# Patient Record
Sex: Male | Born: 1960 | Race: Black or African American | Hispanic: No | Marital: Married | Smoking: Former smoker
Health system: Southern US, Community
[De-identification: ages and names within clinical notes are randomized; demographics above are authoritative.]

## PROBLEM LIST (undated history)

## (undated) DIAGNOSIS — M1990 Unspecified osteoarthritis, unspecified site: Secondary | ICD-10-CM

## (undated) DIAGNOSIS — D693 Immune thrombocytopenic purpura: Secondary | ICD-10-CM

## (undated) DIAGNOSIS — I10 Essential (primary) hypertension: Secondary | ICD-10-CM

## (undated) DIAGNOSIS — D72819 Decreased white blood cell count, unspecified: Secondary | ICD-10-CM

## (undated) HISTORY — PX: SHOULDER ARTHROSCOPY W/ ROTATOR CUFF REPAIR: SHX2400

## (undated) HISTORY — PX: COLONOSCOPY: SHX174

## (undated) HISTORY — PX: EYE SURGERY: SHX253

---

## 1985-04-12 HISTORY — PX: OTHER SURGICAL HISTORY: SHX169

## 2001-04-28 ENCOUNTER — Encounter: Payer: Self-pay | Admitting: Unknown Physician Specialty

## 2001-04-28 ENCOUNTER — Ambulatory Visit (HOSPITAL_COMMUNITY): Admission: RE | Admit: 2001-04-28 | Discharge: 2001-04-28 | Payer: Self-pay | Admitting: Unknown Physician Specialty

## 2001-04-30 ENCOUNTER — Encounter: Payer: Self-pay | Admitting: Unknown Physician Specialty

## 2001-04-30 ENCOUNTER — Ambulatory Visit (HOSPITAL_COMMUNITY): Admission: RE | Admit: 2001-04-30 | Discharge: 2001-04-30 | Payer: Self-pay | Admitting: Unknown Physician Specialty

## 2001-06-05 ENCOUNTER — Encounter (HOSPITAL_COMMUNITY): Admission: RE | Admit: 2001-06-05 | Discharge: 2001-07-05 | Payer: Self-pay | Admitting: Orthopaedic Surgery

## 2006-09-14 ENCOUNTER — Encounter: Admission: RE | Admit: 2006-09-14 | Discharge: 2006-09-14 | Payer: Self-pay | Admitting: Emergency Medicine

## 2010-05-08 ENCOUNTER — Encounter: Payer: Self-pay | Admitting: Internal Medicine

## 2010-05-18 ENCOUNTER — Encounter: Payer: Self-pay | Admitting: Internal Medicine

## 2010-05-18 ENCOUNTER — Ambulatory Visit (INDEPENDENT_AMBULATORY_CARE_PROVIDER_SITE_OTHER): Payer: BC Managed Care – PPO | Admitting: Internal Medicine

## 2010-05-18 DIAGNOSIS — I10 Essential (primary) hypertension: Secondary | ICD-10-CM | POA: Insufficient documentation

## 2010-05-18 DIAGNOSIS — I498 Other specified cardiac arrhythmias: Secondary | ICD-10-CM | POA: Insufficient documentation

## 2010-05-18 DIAGNOSIS — I471 Supraventricular tachycardia: Secondary | ICD-10-CM

## 2010-05-28 NOTE — Assessment & Plan Note (Signed)
Summary: nep:per sk to double book. dx: svt. per devonna office [phone]...   CC:  nep/svt.  History of Present Illness: Dakota Carrillo is seen at the request of Dr.Ganji because of exercised induced tachycardia.  The patient is a 50 year old male with a history of hypertension submitted for stress testing during which he had runs of narrow QRS  tachycardia at cycle length of the 240 ms;  these would last 5-10 beats. They were unassociated with symptoms. The patient has no history of palpitations lightheadedness syncope or recent change in exercise tolerance.  his past medical history is also notable for hyperlipidemia. There is no description of the prior ecxho   Current Medications (verified): 1)  Pravachol 20 Mg Tabs (Pravastatin Sodium) .... Once Daily 2)  Aspirin 81 Mg Tabs (Aspirin) .... Take One Tablet Once Daily 3)  Fish Oil 1000 Mg Caps (Omega-3 Fatty Acids) .... Once Daily 4)  Niacin 500 Mg Tabs (Niacin) .... Take One Tablet Once Daily 5)  Accupril 20 Mg Tabs (Quinapril Hcl) .... Take One Tablet Two Times A Day 6)  Tiazac 180 Mg Xr24h-Cap (Diltiazem Hcl Er Beads) .... 2 Tablets Once Daily 7)  Metoprolol Tartrate 25 Mg Tabs (Metoprolol Tartrate) .... Take One Tablet Two Times A Day-Pt Has Not Started This Medication Yet  Allergies (verified): No Known Drug Allergies  Past History:  Past Medical History: Last updated: 05/15/2010 Paroxysmal supraventricular tachycardia Benign essential hypertension Mixed hyperlipidemia Abnormal exercise treadmill stress test 05/08/2010.  Family History: Last updated: 05/15/2010 Mother-hypertension Father-died at age 3 of lung cancer 4 siblings-hypertension  Social History: Last updated: 05/15/2010 Tobacco Use - Former. -quit 20 years ago.   Married-6 children  Past Surgical History: ear surgery Rotator cuff surgery  Review of Systems       full review of systems was negative apart from a history of present illness and past  medical history significant right knee injury secondary to trauma   Vital Signs:  Patient profile:   50 year old male Height:      78 inches Weight:      251 pounds BMI:     29.11 Pulse rate:   67 / minute Pulse rhythm:   regular BP sitting:   122 / 77  (left arm) Cuff size:   large  Vitals Entered By: Dakota Carrillo CMA (May 18, 2010 9:07 AM)  Physical Exam  General:  The patient was alert and oriented middle-aged [place] American male appearing his stated age in no acute distress. HEENT Normal.  Neck veins were flat, carotids were brisk. supple Lymphadenopathy-negative submandibular and cervical Lungs were clear.  Heart sounds were regular without murmurs or gallops.  Abdomen was soft with active bowel sounds. There is no clubbing cyanosis or edema. Skin Warm and dry grossly normal motor and sensory function No obvious musculoskeletal defect    Impression & Recommendations:  Problem # 1:  SUPRAVENTRICULAR TACHYCARDIA (ICD-427.89) the patient has exercise-induced nonsustained tachycardia that is likely mediated via   a concealed accessory pathway.the patient has no symptoms attributable to it at this point. I do not think that the data from [facility] and pre-excited atrial fibrillation RR intervals applies to this patient subset. Specifically I don't know that we haven't treated risk associated with closely coupled RR intervals with ventricular activition via the AV node.  I am not sure that undertaking a procedure to eliminate his accessory pathway is indicated at this point. I do agree with the use of a beta blocker. I also  think there would be value in repeat stress testing although the endpoint of this is not so clear to me.  I reviewed the above with the patient including the limitations of the data. His updated medication list for this problem includes:    Aspirin 81 Mg Tabs (Aspirin) .Marland Kitchen... Take one tablet once daily    Accupril 20 Mg Tabs (Quinapril hcl) .Marland Kitchen... Take one  tablet two times a day    Tiazac 180 Mg Xr24h-cap (Diltiazem hcl er beads) .Marland Kitchen... 2 tablets once daily    Metoprolol Tartrate 25 Mg Tabs (Metoprolol tartrate) .Marland Kitchen... Take one tablet two times a day-pt has not started this medication yet  Orders: EKG w/ Interpretation (93000)  Problem # 2:  HYPERTENSION, BENIGN (ICD-401.1)  currently controlled.  His updated medication list for this problem includes:    Aspirin 81 Mg Tabs (Aspirin) .Marland Kitchen... Take one tablet once daily    Accupril 20 Mg Tabs (Quinapril hcl) .Marland Kitchen... Take one tablet two times a day    Tiazac 180 Mg Xr24h-cap (Diltiazem hcl er beads) .Marland Kitchen... 2 tablets once daily    Metoprolol Tartrate 25 Mg Tabs (Metoprolol tartrate) .Marland Kitchen... Take one tablet two times a day-pt has not started this medication yet  Patient Instructions: 1)  Your physician recommends that you schedule a follow-up appointment in: PENDING 2)  Your physician recommends that you continue on your current medications as directed. Please refer to the Current Medication list given to you today.

## 2010-06-18 NOTE — Letter (Signed)
Summary: [facility]   Imported By: Marylou Mccoy 06/05/2010 11:42:40  _____________________________________________________________________  External Attachment:    Type:   Image     Comment:   External Document

## 2011-06-24 ENCOUNTER — Other Ambulatory Visit: Payer: Self-pay | Admitting: Family Medicine

## 2011-06-24 ENCOUNTER — Ambulatory Visit
Admission: RE | Admit: 2011-06-24 | Discharge: 2011-06-24 | Disposition: A | Discharge status: Home / Self Care | Payer: BC Managed Care – PPO | Source: Ambulatory Visit | Attending: Family Medicine | Admitting: Family Medicine

## 2011-06-24 DIAGNOSIS — Z139 Encounter for screening, unspecified: Secondary | ICD-10-CM

## 2014-02-10 HISTORY — PX: SHOULDER ARTHROSCOPY W/ ROTATOR CUFF REPAIR: SHX2400

## 2015-04-11 NOTE — H&P (Signed)
TOTAL HIP ADMISSION H&P  Patient is admitted for right total hip arthroplasty.  Subjective:  Chief Complaint: right hip pain  HPI: Dakota Carrillo, 54 y.o. male, has a history of pain and functional disability in the right hip(s) due to arthritis and patient has failed non-surgical conservative treatments for greater than 12 weeks to include NSAID's and/or analgesics, corticosteriod injections, supervised PT with diminished ADL's post treatment and activity modification.  Onset of symptoms was gradual starting 8 years ago with gradually worsening course since that time.The patient noted no past surgery on the right hip(s).  Patient currently rates pain in the right hip at 8 out of 10 with activity. Patient has night pain, worsening of pain with activity and weight bearing, pain that interfers with activities of daily living and pain with passive range of motion. Patient has evidence of joint space narrowing by imaging studies. This condition presents safety issues increasing the risk of falls.  There is no current active infection.  Patient Active Problem List   Diagnosis Date Noted  . HYPERTENSION, BENIGN 05/18/2010  . SUPRAVENTRICULAR TACHYCARDIA 05/18/2010   No past medical history on file.  No past surgical history on file.  No prescriptions prior to admission   Allergies not on file  Social History  Substance Use Topics  . Smoking status: Not on file  . Smokeless tobacco: Not on file  . Alcohol Use: Not on file    No family history on file.   Review of Systems  Constitutional: Negative for fever and chills.  HENT: Negative for congestion and sore throat.   Eyes: Negative for double vision.  Respiratory: Negative for cough, shortness of breath and wheezing.   Cardiovascular: Negative for chest pain and palpitations.  Gastrointestinal: Negative for nausea, vomiting and abdominal pain.  Musculoskeletal:       Right hip pain with prolonged ambulation and night pain  Skin:  Negative for rash.  Neurological: Negative for dizziness, loss of consciousness and headaches.  Psychiatric/Behavioral: Negative for depression and suicidal ideas.  All other systems reviewed and are negative.   Objective:  Physical Exam  Vitals reviewed. Constitutional: He is oriented to person, place, and time. He appears well-developed and well-nourished.  HENT:  Head: Normocephalic and atraumatic.  Eyes: Conjunctivae and EOM are normal. Pupils are equal, round, and reactive to light.  Neck: Normal range of motion. Neck supple.  Cardiovascular: Normal rate and intact distal pulses.   Respiratory: Effort normal and breath sounds normal.  GI: Soft. Bowel sounds are normal.  Musculoskeletal: Normal range of motion.  Right hip pain with log roll  Neurological: He is alert and oriented to person, place, and time.  Skin: Skin is warm and dry.  Psychiatric: He has a normal mood and affect. His behavior is normal. Judgment and thought content normal.    Vital signs in last 24 hours:    Labs:   Estimated body mass index is 29.01 kg/(m^2) as calculated from the following:   Height as of 05/18/10:  (1.981 m).   Weight as of 05/18/10: 113.853 kg (251 lb).   Imaging Review Plain radiographs demonstrate severe degenerative joint disease of the right hip(s). The bone quality appears to be fair for age and reported activity level.  Assessment/Plan:  End stage arthritis, right hip(s)  The patient history, physical examination, clinical judgement of the provider and imaging studies are consistent with end stage degenerative joint disease of the right hip(s) and total hip arthroplasty is deemed medically necessary.  The treatment options including medical management, injection therapy, arthroscopy and arthroplasty were discussed at length. The risks and benefits of total hip arthroplasty were presented and reviewed. The risks due to aseptic loosening, infection, stiffness,  dislocation/subluxation,  thromboembolic complications and other imponderables were discussed.  The patient acknowledged the explanation, agreed to proceed with the plan and consent was signed. Patient is being admitted for inpatient treatment for surgery, pain control, PT, OT, prophylactic antibiotics, VTE prophylaxis, progressive ambulation and ADL's and discharge planning.The patient is planning to be discharged home with home health services

## 2015-04-17 NOTE — Pre-Procedure Instructions (Signed)
Dakota EdouardLincoln J Oshima  04/17/2015     No Pharmacies Listed   Your procedure is scheduled on Tues, Jan 17 @ 10:00 AM  [facility] Admitting at 8:00 AM  Call this number if you have problems the morning of surgery:  [phone]   Remember:  Do not eat food or drink liquids after midnight.  Take these medicines the morning of surgery with A SIP OF WATER Diltiazem (Tiazac), eye drops if needed             No Goody's,BC's,Aleve,Aspirin,Ibuprofen,Advil,Motrin,Fish Oil,or any Herbal Medications, Meloxicam (Mobic)    Do not wear jewelry.  Do not wear lotions, powders, or colognes.  You may wear deodorant.             Men may shave face and neck.  Do not bring valuables to the hospital.  [facility] is not responsible for any belongings or valuables.  Contacts, dentures or bridgework may not be worn into surgery.  Leave your suitcase in the car.  After surgery it may be brought to your room.  For patients admitted to the hospital, discharge time will be determined by your treatment team.  Patients discharged the day of surgery will not be allowed to drive home.    Special instructions:  [facility] - Preparing for Surgery  Before surgery, you can play an important role.  Because skin is not sterile, your skin needs to be as free of germs as possible.  You can reduce the number of germs on you skin by washing with CHG (chlorahexidine gluconate) soap before surgery.  CHG is an antiseptic cleaner which kills germs and bonds with the skin to continue killing germs even after washing.  Please DO NOT use if you have an allergy to CHG or antibacterial soaps.  If your skin becomes reddened/irritated stop using the CHG and inform your nurse when you arrive at Short Stay.  Do not shave (including legs and underarms) for at least 48 hours prior to the first CHG shower.  You may shave your face.  Please follow these instructions carefully:   1.  Shower with CHG Soap the night  before surgery and the                                morning of Surgery.  2.  If you choose to wash your hair, wash your hair first as usual with your       normal shampoo.  3.  After you shampoo, rinse your hair and body thoroughly to remove the                      Shampoo.  4.  Use CHG as you would any other liquid soap.  You can apply chg directly       to the skin and wash gently with scrungie or a clean washcloth.  5.  Apply the CHG Soap to your body ONLY FROM THE NECK DOWN.        Do not use on open wounds or open sores.  Avoid contact with your eyes,       ears, mouth and genitals (private parts).  Wash genitals (private parts)       with your normal soap.  6.  Wash thoroughly, paying special attention to the area where your surgery        will be performed.  7.  Thoroughly rinse your body with warm water from the neck down.  8.  DO NOT shower/wash with your normal soap after using and rinsing off       the CHG Soap.  9.  Pat yourself dry with a clean towel.            10.  Wear clean pajamas.            11.  Place clean sheets on your bed the night of your first shower and do not        sleep with pets.  Day of Surgery  Do not apply any lotions/deoderants the morning of surgery.  Please wear clean clothes to the hospital/surgery center.    Please read over the following fact sheets that you were given. Pain Booklet, Coughing and Deep Breathing, Blood Transfusion Information, MRSA Information and Surgical Site Infection Prevention

## 2015-04-18 ENCOUNTER — Encounter (HOSPITAL_COMMUNITY)
Admission: RE | Admit: 2015-04-18 | Discharge: 2015-04-18 | Disposition: A | Discharge status: Home / Self Care | Payer: BLUE CROSS/BLUE SHIELD | Source: Ambulatory Visit | Attending: Orthopedic Surgery | Admitting: Orthopedic Surgery

## 2015-04-18 ENCOUNTER — Encounter (HOSPITAL_COMMUNITY): Payer: Self-pay

## 2015-04-18 DIAGNOSIS — M1611 Unilateral primary osteoarthritis, right hip: Secondary | ICD-10-CM | POA: Diagnosis not present

## 2015-04-18 DIAGNOSIS — Z0183 Encounter for blood typing: Secondary | ICD-10-CM | POA: Insufficient documentation

## 2015-04-18 DIAGNOSIS — D72819 Decreased white blood cell count, unspecified: Secondary | ICD-10-CM | POA: Insufficient documentation

## 2015-04-18 DIAGNOSIS — Z01812 Encounter for preprocedural laboratory examination: Secondary | ICD-10-CM | POA: Diagnosis not present

## 2015-04-18 DIAGNOSIS — Z79899 Other long term (current) drug therapy: Secondary | ICD-10-CM | POA: Diagnosis not present

## 2015-04-18 DIAGNOSIS — Z01818 Encounter for other preprocedural examination: Secondary | ICD-10-CM | POA: Insufficient documentation

## 2015-04-18 DIAGNOSIS — I10 Essential (primary) hypertension: Secondary | ICD-10-CM | POA: Insufficient documentation

## 2015-04-18 DIAGNOSIS — D696 Thrombocytopenia, unspecified: Secondary | ICD-10-CM | POA: Insufficient documentation

## 2015-04-18 HISTORY — DX: Immune thrombocytopenic purpura: D69.3

## 2015-04-18 HISTORY — DX: Decreased white blood cell count, unspecified: D72.819

## 2015-04-18 HISTORY — DX: Unspecified osteoarthritis, unspecified site: M19.90

## 2015-04-18 HISTORY — DX: Essential (primary) hypertension: I10

## 2015-04-18 LAB — TYPE AND SCREEN
ABO/RH(D): A POS
ANTIBODY SCREEN: NEGATIVE

## 2015-04-18 LAB — CBC
HEMATOCRIT: 36.3 % — AB (ref 39.0–52.0)
HEMOGLOBIN: 12 g/dL — AB (ref 13.0–17.0)
MCH: 31.6 pg (ref 26.0–34.0)
MCHC: 33.1 g/dL (ref 30.0–36.0)
MCV: 95.5 fL (ref 78.0–100.0)
PLATELETS: 190 10*3/uL (ref 150–400)
RBC: 3.8 MIL/uL — AB (ref 4.22–5.81)
RDW: 13.2 % (ref 11.5–15.5)
WBC: 2.1 10*3/uL — AB (ref 4.0–10.5)

## 2015-04-18 LAB — URINALYSIS, ROUTINE W REFLEX MICROSCOPIC
BILIRUBIN URINE: NEGATIVE
Glucose, UA: NEGATIVE mg/dL
HGB URINE DIPSTICK: NEGATIVE
KETONES UR: NEGATIVE mg/dL
Leukocytes, UA: NEGATIVE
Nitrite: NEGATIVE
Protein, ur: NEGATIVE mg/dL
SPECIFIC GRAVITY, URINE: 1.016 (ref 1.005–1.030)
pH: 7 (ref 5.0–8.0)

## 2015-04-18 LAB — BASIC METABOLIC PANEL
ANION GAP: 6 (ref 5–15)
BUN: 9 mg/dL (ref 6–20)
CHLORIDE: 108 mmol/L (ref 101–111)
CO2: 27 mmol/L (ref 22–32)
CREATININE: 0.76 mg/dL (ref 0.61–1.24)
Calcium: 9.2 mg/dL (ref 8.9–10.3)
GFR calc non Af Amer: >60 mL/min (ref >60)
Glucose, Bld: 93 mg/dL (ref 65–99)
POTASSIUM: 3.9 mmol/L (ref 3.5–5.1)
SODIUM: 141 mmol/L (ref 135–145)

## 2015-04-18 LAB — SURGICAL PCR SCREEN
MRSA, PCR: NEGATIVE
Staphylococcus aureus: NEGATIVE

## 2015-04-18 LAB — ABO/RH: ABO/RH(D): A POS

## 2015-04-18 LAB — PROTIME-INR
INR: 1.14 (ref 0.00–1.49)
Prothrombin Time: 14.8 seconds (ref 11.6–15.2)

## 2015-04-18 NOTE — Progress Notes (Addendum)
PCP is Dr. Mirna [facility]Gerald Hill [facility] Cardiologist is [facility] whom he sees for his high BP Request sent for any heart studies, ekg, and last office visit-from Dr Shawnee KnappLevy   Note in epic from Dr Graciela HusbandsKlein Request sent for last cbc with Dr Mirna MiresGerald Hill

## 2015-04-22 ENCOUNTER — Encounter (HOSPITAL_COMMUNITY): Payer: Self-pay

## 2015-04-22 NOTE — Progress Notes (Signed)
Anesthesia Chart Review:  Pt is 55 year old male scheduled for R total hip arthroplasty anterior approach on 04/29/2015 with Dr. Wandra Feinstein. Murphy.   PCP is Dr. Mirna MiresGerald Hill. Sees Dr. Di KindlePatel Levy with vascular surgery for HTN (care everywhere).   PMH includes:  HTN, chronic leukopenia, chronic thrombocytopenia. Never smoker. BMI 28.   Medications include: diltiazem, pravastatin, quinapril.   Preoperative labs reviewed.  WBC 2.1. No prior for comparison.   EKG 04/18/2015: NSR.  Echo 06/11/10 Jacinto Halim(Ganji):  1. LV cavity normal in size, normal diastolic filling. Normal global wall motion. Normal systolic global function. EF 58% 2. LA cavity is moderately dilated.  3. Trace MR. Normal diastolic flow pattern.   Exercise stress test 07/07/10 Jacinto Halim(Ganji): Patient exercised for 9 minutes and stress terminated due to fatigue and leg pain. There were frequent paroxysms of SVT with heart rates 260-300 bpm and in the recovery had sustained SVT for 30 seconds. Patient was asymptomatic. This is an improvement compared to the stress test done on 05/08/10.   Pt saw Dr. Jacinto HalimGanji in January 2012 and had "recurrent paroxysms of narrow complex tachycardia". Referred to Dr. Graciela HusbandsKlein for evaluation who diagnosed SVT and put pt on beta blocker. Pt's allergies now include metoprolol, and it appears pt was placed on diltiazem instead.   I spoke with pt by telephone. He reports he was not asked to return to see Dr. Graciela HusbandsKlein for follow up after initial visit in 2012. Denies palpitations, tachycardia, irregular heart beat. Also reports has chronic leukopenia and thrombocytopenia and was worked up by [employer] for the problem in the mid-1990's.   If no changes, I anticipate pt can proceed with surgery as scheduled.   Rica Mastngela Anamika Kueker, FNP-BC [facility]/Anesthesiology Phone: [phone] 04/22/2015 3:37 PM

## 2015-04-28 MED ORDER — CHLORHEXIDINE GLUCONATE 4 % EX LIQD: 60.0000 mL | Freq: Once | CUTANEOUS | Status: DC

## 2015-04-28 MED ORDER — ACETAMINOPHEN 500 MG PO TABS
1000.0000 mg | ORAL_TABLET | Freq: Once | ORAL | Status: AC
  Administered 2015-04-29: 1000 mg via ORAL

## 2015-04-28 MED ORDER — TRANEXAMIC ACID 1000 MG/10ML IV SOLN
1000.0000 mg | INTRAVENOUS | Status: AC
  Administered 2015-04-29: 1000 mg via INTRAVENOUS
  Filled 2015-04-28: qty 10

## 2015-04-28 MED ORDER — CEFAZOLIN SODIUM-DEXTROSE 2-3 GM-% IV SOLR
2.0000 g | INTRAVENOUS | Status: AC
  Administered 2015-04-29: 2 g via INTRAVENOUS

## 2015-04-28 MED ORDER — POTASSIUM CHLORIDE IN NACL 20-0.45 MEQ/L-% IV SOLN
INTRAVENOUS | Status: DC
  Filled 2015-04-28: qty 1000

## 2015-04-29 ENCOUNTER — Inpatient Hospital Stay (HOSPITAL_COMMUNITY): Payer: BLUE CROSS/BLUE SHIELD | Admitting: Vascular Surgery

## 2015-04-29 ENCOUNTER — Inpatient Hospital Stay (HOSPITAL_COMMUNITY): Payer: BLUE CROSS/BLUE SHIELD

## 2015-04-29 ENCOUNTER — Inpatient Hospital Stay (HOSPITAL_COMMUNITY): Payer: BLUE CROSS/BLUE SHIELD | Admitting: Anesthesiology

## 2015-04-29 ENCOUNTER — Encounter (HOSPITAL_COMMUNITY): Payer: Self-pay | Admitting: Certified Registered Nurse Anesthetist

## 2015-04-29 ENCOUNTER — Inpatient Hospital Stay (HOSPITAL_COMMUNITY)
Admission: RE | Admit: 2015-04-29 | Discharge: 2015-04-30 | DRG: 470 | Disposition: A | Discharge status: Home Health | Payer: BLUE CROSS/BLUE SHIELD | Source: Ambulatory Visit | Attending: Orthopedic Surgery | Admitting: Orthopedic Surgery

## 2015-04-29 ENCOUNTER — Encounter (HOSPITAL_COMMUNITY)
Admission: RE | Disposition: A | Discharge status: Home Health | Payer: Self-pay | Source: Ambulatory Visit | Attending: Orthopedic Surgery

## 2015-04-29 DIAGNOSIS — I10 Essential (primary) hypertension: Secondary | ICD-10-CM | POA: Diagnosis present

## 2015-04-29 DIAGNOSIS — Z419 Encounter for procedure for purposes other than remedying health state, unspecified: Secondary | ICD-10-CM

## 2015-04-29 DIAGNOSIS — Z96649 Presence of unspecified artificial hip joint: Secondary | ICD-10-CM

## 2015-04-29 DIAGNOSIS — D693 Immune thrombocytopenic purpura: Secondary | ICD-10-CM | POA: Diagnosis present

## 2015-04-29 DIAGNOSIS — M25551 Pain in right hip: Secondary | ICD-10-CM | POA: Diagnosis present

## 2015-04-29 DIAGNOSIS — M1611 Unilateral primary osteoarthritis, right hip: Secondary | ICD-10-CM | POA: Diagnosis present

## 2015-04-29 DIAGNOSIS — D72819 Decreased white blood cell count, unspecified: Secondary | ICD-10-CM | POA: Diagnosis present

## 2015-04-29 HISTORY — PX: TOTAL HIP ARTHROPLASTY: SHX124

## 2015-04-29 SURGERY — ARTHROPLASTY, HIP, TOTAL, ANTERIOR APPROACH
Anesthesia: Spinal | Site: Hip | Laterality: Right

## 2015-04-29 MED ORDER — METHOCARBAMOL 500 MG PO TABS
500.0000 mg | ORAL_TABLET | Freq: Four times a day (QID) | ORAL | Status: DC | PRN
  Administered 2015-04-29 – 2015-04-30 (×3): 500 mg via ORAL
  Filled 2015-04-29 (×2): qty 1

## 2015-04-29 MED ORDER — METHOCARBAMOL 1000 MG/10ML IJ SOLN
500.0000 mg | Freq: Four times a day (QID) | INTRAVENOUS | Status: DC | PRN
  Filled 2015-04-29: qty 5

## 2015-04-29 MED ORDER — CEFAZOLIN SODIUM-DEXTROSE 2-3 GM-% IV SOLR
INTRAVENOUS | Status: AC
  Filled 2015-04-29: qty 50

## 2015-04-29 MED ORDER — METOCLOPRAMIDE HCL 5 MG PO TABS: 5.0000 mg | ORAL_TABLET | Freq: Three times a day (TID) | ORAL | Status: DC | PRN

## 2015-04-29 MED ORDER — ASPIRIN EC 325 MG PO TBEC
325.0000 mg | DELAYED_RELEASE_TABLET | Freq: Every day | ORAL | Status: DC
  Administered 2015-04-30: 325 mg via ORAL
  Filled 2015-04-29: qty 1

## 2015-04-29 MED ORDER — PROPOFOL 500 MG/50ML IV EMUL
INTRAVENOUS | Status: DC | PRN
  Administered 2015-04-29: 12:00:00 via INTRAVENOUS
  Administered 2015-04-29: 50 ug/kg/min via INTRAVENOUS

## 2015-04-29 MED ORDER — DILTIAZEM HCL ER COATED BEADS 180 MG PO CP24: 180.0000 mg | ORAL_CAPSULE | Freq: Two times a day (BID) | ORAL | Status: DC

## 2015-04-29 MED ORDER — HYDROCODONE-ACETAMINOPHEN 5-325 MG PO TABS
ORAL_TABLET | ORAL | Status: AC
  Filled 2015-04-29: qty 1

## 2015-04-29 MED ORDER — QUINAPRIL HCL 10 MG PO TABS
20.0000 mg | ORAL_TABLET | Freq: Every day | ORAL | Status: DC
  Administered 2015-04-29: 20 mg via ORAL
  Filled 2015-04-29 (×2): qty 2

## 2015-04-29 MED ORDER — METHOCARBAMOL 500 MG PO TABS
ORAL_TABLET | ORAL | Status: AC
  Filled 2015-04-29: qty 1

## 2015-04-29 MED ORDER — ONDANSETRON HCL 4 MG/2ML IJ SOLN: 4.0000 mg | Freq: Once | INTRAMUSCULAR | Status: DC | PRN

## 2015-04-29 MED ORDER — LIDOCAINE HCL (CARDIAC) 20 MG/ML IV SOLN
INTRAVENOUS | Status: DC | PRN
  Administered 2015-04-29: 100 mg via INTRAVENOUS

## 2015-04-29 MED ORDER — CEFAZOLIN SODIUM-DEXTROSE 2-3 GM-% IV SOLR
2.0000 g | Freq: Four times a day (QID) | INTRAVENOUS | Status: AC
  Administered 2015-04-29 – 2015-04-30 (×2): 2 g via INTRAVENOUS
  Filled 2015-04-29 (×2): qty 50

## 2015-04-29 MED ORDER — POTASSIUM CHLORIDE IN NACL 20-0.45 MEQ/L-% IV SOLN
INTRAVENOUS | Status: DC
  Administered 2015-04-29 – 2015-04-30 (×2): via INTRAVENOUS
  Filled 2015-04-29 (×3): qty 1000

## 2015-04-29 MED ORDER — ONDANSETRON HCL 4 MG/2ML IJ SOLN: 4.0000 mg | Freq: Four times a day (QID) | INTRAMUSCULAR | Status: DC | PRN

## 2015-04-29 MED ORDER — ASPIRIN 325 MG PO TABS: 325.0000 mg | ORAL_TABLET | Freq: Every day | ORAL | Status: AC

## 2015-04-29 MED ORDER — METHOCARBAMOL 500 MG PO TABS: 500.0000 mg | ORAL_TABLET | Freq: Four times a day (QID) | ORAL | Status: AC

## 2015-04-29 MED ORDER — DOCUSATE SODIUM 100 MG PO CAPS: 100.0000 mg | ORAL_CAPSULE | Freq: Two times a day (BID) | ORAL | Status: AC

## 2015-04-29 MED ORDER — FENTANYL CITRATE (PF) 250 MCG/5ML IJ SOLN
INTRAMUSCULAR | Status: AC
  Filled 2015-04-29: qty 5

## 2015-04-29 MED ORDER — MEPERIDINE HCL 25 MG/ML IJ SOLN: 6.2500 mg | INTRAMUSCULAR | Status: DC | PRN

## 2015-04-29 MED ORDER — BUPIVACAINE HCL (PF) 0.5 % IJ SOLN
INTRAMUSCULAR | Status: DC | PRN
  Administered 2015-04-29: 3.5 mL via INTRATHECAL

## 2015-04-29 MED ORDER — FENTANYL CITRATE (PF) 250 MCG/5ML IJ SOLN
INTRAMUSCULAR | Status: DC | PRN
  Administered 2015-04-29: 50 ug via INTRAVENOUS

## 2015-04-29 MED ORDER — DILTIAZEM HCL ER BEADS 180 MG PO CP24: 180.0000 mg | ORAL_CAPSULE | Freq: Two times a day (BID) | ORAL | Status: DC

## 2015-04-29 MED ORDER — ONDANSETRON HCL 4 MG/2ML IJ SOLN
INTRAMUSCULAR | Status: AC
  Filled 2015-04-29: qty 2

## 2015-04-29 MED ORDER — HYDROCODONE-ACETAMINOPHEN 5-325 MG PO TABS
1.0000 | ORAL_TABLET | ORAL | Status: DC | PRN
  Administered 2015-04-29: 1 via ORAL
  Administered 2015-04-29: 2 via ORAL
  Administered 2015-04-29: 1 via ORAL
  Administered 2015-04-30 (×3): 2 via ORAL
  Filled 2015-04-29 (×4): qty 2

## 2015-04-29 MED ORDER — ATROPINE SULFATE 1 % OP SOLN
1.0000 [drp] | Freq: Three times a day (TID) | OPHTHALMIC | Status: DC
  Filled 2015-04-29: qty 2

## 2015-04-29 MED ORDER — ONDANSETRON HCL 4 MG PO TABS: 4.0000 mg | ORAL_TABLET | Freq: Four times a day (QID) | ORAL | Status: DC | PRN

## 2015-04-29 MED ORDER — LIDOCAINE HCL (CARDIAC) 20 MG/ML IV SOLN
INTRAVENOUS | Status: AC
  Filled 2015-04-29: qty 5

## 2015-04-29 MED ORDER — PHENYLEPHRINE 40 MCG/ML (10ML) SYRINGE FOR IV PUSH (FOR BLOOD PRESSURE SUPPORT)
PREFILLED_SYRINGE | INTRAVENOUS | Status: AC
  Filled 2015-04-29: qty 10

## 2015-04-29 MED ORDER — CELECOXIB 200 MG PO CAPS
ORAL_CAPSULE | ORAL | Status: AC
  Filled 2015-04-29: qty 1

## 2015-04-29 MED ORDER — DEXAMETHASONE SODIUM PHOSPHATE 10 MG/ML IJ SOLN: 10.0000 mg | Freq: Once | INTRAMUSCULAR | Status: DC

## 2015-04-29 MED ORDER — HYDROCODONE-ACETAMINOPHEN 5-325 MG PO TABS: 1.0000 | ORAL_TABLET | Freq: Four times a day (QID) | ORAL | Status: AC | PRN

## 2015-04-29 MED ORDER — POLYETHYLENE GLYCOL 3350 17 G PO PACK: 17.0000 g | PACK | Freq: Every day | ORAL | Status: DC | PRN

## 2015-04-29 MED ORDER — MIDAZOLAM HCL 2 MG/2ML IJ SOLN
INTRAMUSCULAR | Status: AC
  Filled 2015-04-29: qty 2

## 2015-04-29 MED ORDER — MIDAZOLAM HCL 2 MG/2ML IJ SOLN
INTRAMUSCULAR | Status: DC | PRN
  Administered 2015-04-29: 2 mg via INTRAVENOUS

## 2015-04-29 MED ORDER — METOCLOPRAMIDE HCL 5 MG/ML IJ SOLN: 5.0000 mg | Freq: Three times a day (TID) | INTRAMUSCULAR | Status: DC | PRN

## 2015-04-29 MED ORDER — BUPIVACAINE HCL (PF) 0.5 % IJ SOLN
INTRAMUSCULAR | Status: AC
  Filled 2015-04-29: qty 30

## 2015-04-29 MED ORDER — ACETAMINOPHEN 500 MG PO TABS
ORAL_TABLET | ORAL | Status: AC
  Filled 2015-04-29: qty 2

## 2015-04-29 MED ORDER — LACTATED RINGERS IV SOLN
INTRAVENOUS | Status: DC | PRN
  Administered 2015-04-29 (×2): via INTRAVENOUS

## 2015-04-29 MED ORDER — HYDROMORPHONE HCL 1 MG/ML IJ SOLN: 0.2500 mg | INTRAMUSCULAR | Status: DC | PRN

## 2015-04-29 MED ORDER — PHENOL 1.4 % MT LIQD: 1.0000 | OROMUCOSAL | Status: DC | PRN

## 2015-04-29 MED ORDER — LACTATED RINGERS IV SOLN
INTRAVENOUS | Status: DC
  Administered 2015-04-29: 10:00:00 via INTRAVENOUS

## 2015-04-29 MED ORDER — PRAVASTATIN SODIUM 20 MG PO TABS
20.0000 mg | ORAL_TABLET | Freq: Every day | ORAL | Status: DC
  Administered 2015-04-29: 20 mg via ORAL
  Filled 2015-04-29: qty 1

## 2015-04-29 MED ORDER — HYDROMORPHONE HCL 1 MG/ML IJ SOLN: 1.0000 mg | INTRAMUSCULAR | Status: DC | PRN

## 2015-04-29 MED ORDER — MENTHOL 3 MG MT LOZG: 1.0000 | LOZENGE | OROMUCOSAL | Status: DC | PRN

## 2015-04-29 MED ORDER — PROPOFOL 10 MG/ML IV BOLUS
INTRAVENOUS | Status: DC | PRN
  Administered 2015-04-29: 20 mg via INTRAVENOUS

## 2015-04-29 MED ORDER — ONDANSETRON HCL 4 MG/2ML IJ SOLN
INTRAMUSCULAR | Status: DC | PRN
  Administered 2015-04-29: 4 mg via INTRAVENOUS

## 2015-04-29 MED ORDER — 0.9 % SODIUM CHLORIDE (POUR BTL) OPTIME
TOPICAL | Status: DC | PRN
  Administered 2015-04-29: 1000 mL

## 2015-04-29 MED ORDER — ONDANSETRON HCL 4 MG PO TABS: 4.0000 mg | ORAL_TABLET | Freq: Three times a day (TID) | ORAL | Status: AC | PRN

## 2015-04-29 MED ORDER — DOCUSATE SODIUM 100 MG PO CAPS
100.0000 mg | ORAL_CAPSULE | Freq: Two times a day (BID) | ORAL | Status: DC
  Administered 2015-04-29 – 2015-04-30 (×2): 100 mg via ORAL
  Filled 2015-04-29 (×2): qty 1

## 2015-04-29 MED ORDER — CELECOXIB 200 MG PO CAPS
200.0000 mg | ORAL_CAPSULE | Freq: Two times a day (BID) | ORAL | Status: DC
  Administered 2015-04-29 – 2015-04-30 (×3): 200 mg via ORAL
  Filled 2015-04-29 (×2): qty 1

## 2015-04-29 SURGICAL SUPPLY — 54 items
BAG DECANTER FOR FLEXI CONT (MISCELLANEOUS) IMPLANT
BLADE SAG 18X100X1.27 (BLADE) IMPLANT
BLADE SAW SGTL 18X1.27X75 (BLADE) IMPLANT
BLADE SURG ROTATE 9660 (MISCELLANEOUS) IMPLANT
CAPT HIP TOTAL 2 ×1 IMPLANT
CLSR STERI-STRIP ANTIMIC 1/2X4 (GAUZE/BANDAGES/DRESSINGS) ×2 IMPLANT
COVER PERINEAL POST (MISCELLANEOUS) ×2 IMPLANT
COVER SURGICAL LIGHT HANDLE (MISCELLANEOUS) ×2 IMPLANT
DRAPE C-ARM 42X72 X-RAY (DRAPES) IMPLANT
DRAPE STERI IOBAN 125X83 (DRAPES) ×2 IMPLANT
DRAPE U-SHAPE 47X51 STRL (DRAPES) ×4 IMPLANT
DRSG MEPILEX BORDER 4X8 (GAUZE/BANDAGES/DRESSINGS) ×2 IMPLANT
DURAPREP 26ML APPLICATOR (WOUND CARE) ×2 IMPLANT
ELECT BLADE 4.0 EZ CLEAN MEGAD (MISCELLANEOUS) ×2
ELECT REM PT RETURN 9FT ADLT (ELECTROSURGICAL) ×2
ELECTRODE BLDE 4.0 EZ CLN MEGD (MISCELLANEOUS) ×1 IMPLANT
ELECTRODE REM PT RTRN 9FT ADLT (ELECTROSURGICAL) ×1 IMPLANT
FACESHIELD WRAPAROUND (MASK) ×4 IMPLANT
FACESHIELD WRAPAROUND OR TEAM (MASK) ×2 IMPLANT
GLOVE BIO SURGEON STRL SZ7 (GLOVE) ×2 IMPLANT
GLOVE BIO SURGEON STRL SZ7.5 (GLOVE) ×2 IMPLANT
GLOVE BIOGEL PI IND STRL 7.0 (GLOVE) ×1 IMPLANT
GLOVE BIOGEL PI IND STRL 8 (GLOVE) ×1 IMPLANT
GLOVE BIOGEL PI INDICATOR 7.0 (GLOVE) ×1
GLOVE BIOGEL PI INDICATOR 8 (GLOVE) ×1
GOWN STRL REUS W/ TWL LRG LVL3 (GOWN DISPOSABLE) ×2 IMPLANT
GOWN STRL REUS W/ TWL XL LVL3 (GOWN DISPOSABLE) ×1 IMPLANT
GOWN STRL REUS W/TWL LRG LVL3 (GOWN DISPOSABLE) ×4
GOWN STRL REUS W/TWL XL LVL3 (GOWN DISPOSABLE) ×2
KIT BASIN OR (CUSTOM PROCEDURE TRAY) ×2 IMPLANT
KIT ROOM TURNOVER OR (KITS) ×2 IMPLANT
MANIFOLD NEPTUNE II (INSTRUMENTS) ×2 IMPLANT
NDL 18GX1X1/2 (RX/OR ONLY) (NEEDLE) IMPLANT
NDL SAFETY ECLIPSE 18X1.5 (NEEDLE) ×1 IMPLANT
NEEDLE 18GX1X1/2 (RX/OR ONLY) (NEEDLE) IMPLANT
NEEDLE HYPO 18GX1.5 SHARP (NEEDLE) ×2
NS IRRIG 1000ML POUR BTL (IV SOLUTION) ×2 IMPLANT
PACK TOTAL JOINT (CUSTOM PROCEDURE TRAY) ×2 IMPLANT
PACK UNIVERSAL I (CUSTOM PROCEDURE TRAY) ×2 IMPLANT
PAD ARMBOARD 7.5X6 YLW CONV (MISCELLANEOUS) ×2 IMPLANT
SPONGE LAP 18X18 X RAY DECT (DISPOSABLE) IMPLANT
SUT MNCRL AB 4-0 PS2 18 (SUTURE) ×2 IMPLANT
SUT MON AB 2-0 CT1 36 (SUTURE) ×2 IMPLANT
SUT VIC AB 0 CT1 27 (SUTURE) ×2
SUT VIC AB 0 CT1 27XBRD ANBCTR (SUTURE) ×1 IMPLANT
SUT VIC AB 1 CT1 27 (SUTURE) ×2
SUT VIC AB 1 CT1 27XBRD ANBCTR (SUTURE) ×1 IMPLANT
SYR 50ML LL SCALE MARK (SYRINGE) ×2 IMPLANT
SYRINGE 20CC LL (MISCELLANEOUS) IMPLANT
TOWEL OR 17X24 6PK STRL BLUE (TOWEL DISPOSABLE) ×2 IMPLANT
TOWEL OR 17X26 10 PK STRL BLUE (TOWEL DISPOSABLE) ×2 IMPLANT
TRAY FOLEY CATH 16FRSI W/METER (SET/KITS/TRAYS/PACK) IMPLANT
WATER STERILE IRR 1000ML POUR (IV SOLUTION) ×2 IMPLANT
YANKAUER SUCT BULB TIP NO VENT (SUCTIONS) ×2 IMPLANT

## 2015-04-29 NOTE — Discharge Instructions (Signed)
INSTRUCTIONS AFTER JOINT REPLACEMENT  ° °o Remove items at home which could result in a fall. This includes throw rugs or furniture in walking pathways °o ICE to the affected joint every three hours while awake for 30 minutes at a time, for at least the first 3-5 days, and then as needed for pain and swelling.  Continue to use ice for pain and swelling. You may notice swelling that will progress down to the foot and ankle.  This is normal after surgery.  Elevate your leg when you are not up walking on it.   °o Continue to use the breathing machine you got in the hospital (incentive spirometer) which will help keep your temperature down.  It is common for your temperature to cycle up and down following surgery, especially at night when you are not up moving around and exerting yourself.  The breathing machine keeps your lungs expanded and your temperature down. ° ° °DIET:  As you were doing prior to hospitalization, we recommend a well-balanced diet. ° °DRESSING / WOUND CARE / SHOWERING ° °Keep the surgical dressing until follow up.   IF THE DRESSING FALLS OFF or the wound gets wet inside, change the dressing with sterile gauze.  Please use good hand washing techniques before changing the dressing.  Do not use any lotions or creams on the incision until instructed by your surgeon.   ° °ACTIVITY ° °o Increase activity slowly as tolerated, but follow the weight bearing instructions below.   °o No driving for 6 weeks or until further direction given by your physician.  You cannot drive while taking narcotics.  °o No lifting or carrying greater than 10 lbs. until further directed by your surgeon. °o Avoid periods of inactivity such as sitting longer than an hour when not asleep. This helps prevent blood clots.  °o You may return to work once you are authorized by your doctor.  ° ° ° °WEIGHT BEARING  ° °Weight bearing as tolerated with assist device (walker, cane, etc) as directed, use it as long as suggested by your  surgeon or therapist, typically at least 4-6 weeks. ° ° °CONSTIPATION ° °Constipation is defined medically as fewer than three stools per week and severe constipation as less than one stool per week.  Even if you have a regular bowel pattern at home, your normal regimen is likely to be disrupted due to multiple reasons following surgery.  Combination of anesthesia, postoperative narcotics, change in appetite and fluid intake all can affect your bowels.  ° °YOU MUST use at least one of the following options; they are listed in order of increasing strength to get the job done.  They are all available over the counter, and you may need to use some, POSSIBLY even all of these options:   ° °Drink plenty of fluids (prune juice may be helpful) and high fiber foods °Colace 100 mg by mouth twice a day  °Senokot for constipation as directed and as needed Dulcolax (bisacodyl), take with full glass of water  °Miralax (polyethylene glycol) once or twice a day as needed. ° °If you have tried all these things and are unable to have a bowel movement in the first 3-4 days after surgery call either your surgeon or your primary doctor.   ° °If you experience loose stools or diarrhea, hold the medications until you stool forms back up.  If your symptoms do not get better within 1 week or if they get worse, check with your doctor.    If you experience "the worst abdominal pain ever" or develop nausea or vomiting, please contact the office immediately for further recommendations for treatment. ° ° °ITCHING:  If you experience itching with your medications, try taking only a single pain pill, or even half a pain pill at a time.  You can also use Benadryl over the counter for itching or also to help with sleep.  ° °TED HOSE STOCKINGS:  Use stockings on both legs until for at least 2 weeks or as directed by physician office. They may be removed at night for sleeping. ° °MEDICATIONS:  See your medication summary on the “After Visit Summary”  that nursing will review with you.  You may have some home medications which will be placed on hold until you complete the course of blood thinner medication.  It is important for you to complete the blood thinner medication as prescribed. ° °PRECAUTIONS:  If you experience chest pain or shortness of breath - call 911 immediately for transfer to the hospital emergency department.  ° °If you develop a fever greater that 101 F, purulent drainage from wound, increased redness or drainage from wound, foul odor from the wound/dressing, or calf pain - CONTACT YOUR SURGEON.   °                                                °FOLLOW-UP APPOINTMENTS:  If you do not already have a post-op appointment, please call the office for an appointment to be seen by your surgeon.  Guidelines for how soon to be seen are listed in your “After Visit Summary”, but are typically between 1-4 weeks after surgery. ° °OTHER INSTRUCTIONS:  ° °MAKE SURE YOU:  °• Understand these instructions.  °• Get help right away if you are not doing well or get worse.  ° ° °Thank you for letting us be a part of your medical care team.  It is a privilege we respect greatly.  We hope these instructions will help you stay on track for a fast and full recovery!  ° °

## 2015-04-29 NOTE — Progress Notes (Signed)
Handoff aborted.

## 2015-04-29 NOTE — Anesthesia Postprocedure Evaluation (Signed)
Anesthesia Post Note  Patient: Dakota Carrillo  Procedure(s) Performed: Procedure(s) (LRB): TOTAL RIGHT HIP ARTHROPLASTY ANTERIOR APPROACH (Right)  Patient location during evaluation: PACU Anesthesia Type: Spinal and MAC Level of consciousness: awake and alert Pain management: pain level controlled Vital Signs Assessment: post-procedure vital signs reviewed and stable Respiratory status: spontaneous breathing and respiratory function stable Cardiovascular status: blood pressure returned to baseline and stable Postop Assessment: spinal receding Anesthetic complications: no    Last Vitals:  Filed Vitals:   04/29/15 1403 04/29/15 1415  BP: 101/66 104/68  Pulse: 63 47  Temp:    Resp: 6 14    Last Pain: There were no vitals filed for this visit.               Trammell Bowden DAVID

## 2015-04-29 NOTE — Anesthesia Procedure Notes (Signed)
Spinal Patient location during procedure: OR Start time: 04/29/2015 10:25 AM End time: 04/29/2015 10:30 AM Staffing Anesthesiologist: Arta Bruce Performed by: anesthesiologist  Preanesthetic Checklist Completed: patient identified, site marked, surgical consent, pre-op evaluation, timeout performed, IV checked, risks and benefits discussed and monitors and equipment checked Spinal Block Patient position: sitting Prep: Betadine Patient monitoring: heart rate, cardiac monitor, continuous pulse ox and blood pressure Approach: right paramedian Location: L3-4 Injection technique: single-shot Needle Needle type: Pencan  Needle gauge: 24 G Needle length: 9 cm Needle insertion depth: 9 cm

## 2015-04-29 NOTE — Anesthesia Preprocedure Evaluation (Addendum)
Anesthesia Evaluation  Patient identified by MRN, date of birth, ID band Patient awake    Reviewed: Allergy & Precautions, NPO status , Patient's Chart, lab work & pertinent test results  Airway Mallampati: II  TM Distance: >3 FB Neck ROM: Full    Dental  (+) Missing, Dental Advisory Given   Pulmonary    Pulmonary exam normal        Cardiovascular hypertension, Pt. on medications Normal cardiovascular exam     Neuro/Psych    GI/Hepatic   Endo/Other    Renal/GU      Musculoskeletal   Abdominal   Peds  Hematology   Anesthesia Other Findings   Reproductive/Obstetrics                            Anesthesia Physical Anesthesia Plan  ASA: II  Anesthesia Plan: Spinal   Post-op Pain Management:    Induction: Intravenous  Airway Management Planned: Simple Face Mask  Additional Equipment:   Intra-op Plan:   Post-operative Plan:   Informed Consent: I have reviewed the patients History and Physical, chart, labs and discussed the procedure including the risks, benefits and alternatives for the proposed anesthesia with the patient or authorized representative who has indicated his/her understanding and acceptance.     Plan Discussed with: CRNA and Surgeon  Anesthesia Plan Comments:         Anesthesia Quick Evaluation

## 2015-04-29 NOTE — Transfer of Care (Signed)
Immediate Anesthesia Transfer of Care Note  Patient: Dakota Carrillo  Procedure(s) Performed: Procedure(s): TOTAL RIGHT HIP ARTHROPLASTY ANTERIOR APPROACH (Right)  Patient Location: PACU  Anesthesia Type:Spinal  Level of Consciousness: awake, alert  and oriented  Airway & Oxygen Therapy: Patient Spontanous Breathing and Patient connected to face mask oxygen  Post-op Assessment: Report given to RN and Post -op Vital signs reviewed and stable  Post vital signs: Reviewed and stable  Last Vitals:  Filed Vitals:   04/29/15 0815 04/29/15 1308  BP: 142/70 101/58  Pulse: 63 57  Temp: 36.4 C 36.4 C  Resp: 20 9    Complications: No apparent anesthesia complications

## 2015-04-29 NOTE — Interval H&P Note (Signed)
History and Physical Interval Note:  04/29/2015 7:17 AM  [name]  has presented today for surgery, with the diagnosis of OA RIGHT HIP  The various methods of treatment have been discussed with the patient and family. After consideration of risks, benefits and other options for treatment, the patient has consented to  Procedure(s): TOTAL RIGHT HIP ARTHROPLASTY ANTERIOR APPROACH (Right) as a surgical intervention .  The patient's history has been reviewed, patient examined, no change in status, stable for surgery.  I have reviewed the patient's chart and labs.  Questions were answered to the patient's satisfaction.     Dakota Carrillo

## 2015-04-29 NOTE — Op Note (Signed)
04/29/2015  12:24 PM  PATIENT:  Dakota Carrillo   MRN: [id number]  PRE-OPERATIVE DIAGNOSIS:  OA RIGHT HIP  POST-OPERATIVE DIAGNOSIS:  OA RIGHT HIP  PROCEDURE:  Procedure(s): TOTAL RIGHT HIP ARTHROPLASTY ANTERIOR APPROACH  PREOPERATIVE INDICATIONS:    CARLEN FILS is an 55 y.o. male who has a diagnosis of <principal problem not specified> and elected for surgical management after failing conservative treatment.  The risks benefits and alternatives were discussed with the patient including but not limited to the risks of nonoperative treatment, versus surgical intervention including infection, bleeding, nerve injury, periprosthetic fracture, the need for revision surgery, dislocation, leg length discrepancy, blood clots, cardiopulmonary complications, morbidity, mortality, among others, and they were willing to proceed.     OPERATIVE REPORT     SURGEON:   Scherrie Seneca, Ernesta Amble, MD    ASSISTANT:  Lovett Calender, PA-C, She was present and scrubbed throughout the case, critical for completion in a timely fashion, and for retraction, instrumentation, and closure.     ANESTHESIA:  General    COMPLICATIONS:  None.     COMPONENTS:  Stryker acolade fit femur size 8 with a 36 mm -2.5 head ball and a PSL acetabular shell size 66 with a  polyethylene liner    PROCEDURE IN DETAIL:   The patient was met in the holding area and  identified.  The appropriate hip was identified and marked at the operative site.  The patient was then transported to the OR  and  placed under general anesthesia.  At that point, the patient was  placed in the supine position and  secured to the operating room table and all bony prominences padded. He received pre-operative antibiotics    The operative lower extremity was prepped from the iliac crest to the distal leg.  Sterile draping was performed.  Time out was performed prior to incision.      Skin incision was made just 2 cm lateral to the ASIS  extending in  line with the tensor fascia lata. Electrocautery was used to control all bleeders. I dissected down sharply to the fascia of the tensor fascia lata was confirmed that the muscle fibers beneath were running posteriorly. I then incised the fascia over the superficial tensor fascia lata in line with the incision. The fascia was elevated off the anterior aspect of the muscle the muscle was retracted posteriorly and protected throughout the case. I then used electrocautery to incise the tensor fascia lata fascia control and all bleeders. Immediately visible was the fat over top of the anterior neck and capsule.  I removed the anterior fat from the capsule and elevated the rectus muscle off of the anterior capsule. I then removed a large time of capsule. The retractors were then placed over the anterior acetabulum as well as around the superior and inferior neck.  I then removed a section of the femoral neck and a napkin ring fashion. Then used the power course to remove the femoral head from the acetabulum and thoroughly irrigated the acetabulum. I sized the femoral head.    I then exposed the deep acetabulum, cleared out any tissue including the ligamentum teres.   After adequate visualization, I excised the labrum, and then sequentially reamed.  I placed the trial acetabulum, which seated nicely, and then impacted the real cup into place.  Appropriate version and inclination was confirmed clinically matching their bony anatomy, and also with the use transverse acetabular ligament.  I placed a 32m screw in the posterior/superio  position with an excellent bite.    I then placed the polyethylene liner in place  I then abducted the leg and released the external rotators from the posterior femur allowing it to be easily delivered up lateral and anterior to the acetabulum for preparation of the femoral canal.    I then prepared the proximal femur using the cookie-cutter and then sequentially reamed and  broached.  A trial broach, neck, and head was utilized, and I reduced the hip and it was found to have excellent stability with functional range of motion..  I then impacted the real femoral prosthesis into place into the appropriate version, slightly anteverted to the normal anatomy, and I impacted the real head ball into place. The hip was then reduced and taken through functional range of motion and found to have excellent stability. Leg lengths were restored.  I then irrigated the hip copiously again with, and repaired the fascia with Vicryl, followed by monocryl for the subcutaneous tissue, Monocryl for the skin, Steri-Strips and sterile gauze. The patient was then awakened and returned to PACU in stable and satisfactory condition. There were no complications.  POST OPERATIVE PLAN: WBAT, DVT px: SCD's/TED and ASA 325  Edmonia Lynch, MD Orthopedic Surgeon [phone]   This note was generated using a template and dragon dictation system. In light of that, I have reviewed the note and all aspects of it are applicable to this case. Any dictation errors are due to the computerized dictation system.

## 2015-04-30 ENCOUNTER — Encounter (HOSPITAL_COMMUNITY): Payer: Self-pay | Admitting: General Practice

## 2015-04-30 NOTE — Care Management Note (Signed)
Case Management Note  Patient Details  Name: Dakota Carrillo MRN: [id number] Date of Birth: [birth year]  Subjective/Objective:    55 yr old gentleman s/p right total hip arthroplasty.                Action/Plan:  Case manager spoke with patient and his wife concerning home health and DME needs. Patient was preoperatively setup with [facility], no changes. CM has ordered rolling walker, 3in1 and a cane. [name] will have family support at discharge.  Expected Discharge Date:    04/30/15              Expected Discharge Plan:   Home with Home Health  In-House Referral:     Discharge planning Services  CM Consult  Post Acute Care Choice:  Durable Medical Equipment, Home Health Choice offered to:     DME Arranged:  3-N-1, Dakota Carrillo DME Agency:  [facility]  HH Arranged:  PT [facility] Agency:  [facility]  Status of Service:  Completed, signed off  Medicare Important Message Given:    Date Medicare IM Given:    Medicare IM give by:    Date Additional Medicare IM Given:    Additional Medicare Important Message give by:     If discussed at Long Length of Stay Meetings, dates discussed:    Additional Comments:  Dakota Guthrie, RN 04/30/2015, 11:17 AM

## 2015-04-30 NOTE — Evaluation (Signed)
Occupational Therapy Evaluation Patient Details Name: Dakota Carrillo MRN: [id number] DOB: 12-17-1960 Today's Date: 04/30/2015    History of Present Illness Admitted for right THA, Direct Anterior Approach;  has a past medical history of Hypertension; Arthritis; Chronic leukopenia; and Chronic idiopathic thrombocytopenia (HCC). has pertinent past surgical history that includes Shoulder arthroscopy with rotator cuff repair (Right, Nov 2015); and Shoulder arthroscopy w/ rotator cuff repair (Left).   Clinical Impression   Pt s/p above and pt planning to d/c today. Education provided in session and OT is signing off.     Follow Up Recommendations  No OT follow up;Supervision - Intermittent    Equipment Recommendations  3 in 1 bedside comode    Recommendations for Other Services       Precautions / Restrictions Precautions Precautions: None Precaution Comments: explained he has no hip precautions Restrictions Weight Bearing Restrictions: Yes RLE Weight Bearing: Weight bearing as tolerated      Mobility Bed Mobility Overal bed mobility: Modified Independent             General bed mobility comments: supine to sit  Transfers Overall transfer level: Needs assistance Transfers: Sit to/from Stand Sit to Stand: Supervision (pt stood without RW in front; setup for RW as OT retrieved)            Balance Unsteady with ambulation without use of RW.                                       ADL Overall ADL's : Needs assistance/impaired                     Lower Body Dressing: Set up;Supervision/safety;Sit to/from stand   Toilet Transfer: Set up;Supervision/safety;Ambulation;RW (sit to stand from bed)       Tub/ Shower Transfer: Min guard;Walk-in shower;Ambulation   Functional mobility during ADLs:  (Min guard for shower transfer and walking without RW; Supervision for ambulation with RW and setup to retrieve RW to place in front of pt prior  to ambulation with use RW)  General ADL Comments: Educated on safety such as safe footwear, use of bag on walker, recommended spouse be with him for shower transfer and bathing. Educated on shower transfer technique and pt practiced simulated shower transfer. Educated on LB dressing technique. Discussed AE.     Vision     Perception     Praxis      Pertinent Vitals/Pain Pain Assessment: 0-10 Pain Score: 4  Pain Location: RLE  Pain Descriptors / Indicators: Aching Pain Intervention(s): Monitored during session     Hand Dominance     Extremity/Trunk Assessment Upper Extremity Assessment Upper Extremity Assessment: Overall WFL for tasks assessed (Left shoulder-little less than full AROM shoulder flexion; history of bilateral shoulder surgery)   Lower Extremity Assessment Lower Extremity Assessment: Defer to PT evaluation   Cervical / Trunk Assessment Cervical / Trunk Assessment: Normal   Communication Communication Communication: No difficulties   Cognition Arousal/Alertness: Awake/alert Behavior During Therapy: WFL for tasks assessed/performed Overall Cognitive Status: Within Functional Limits for tasks assessed                     General Comments          Shoulder Instructions      Home Living Family/patient expects to be discharged to:: Private residence Living Arrangements: Spouse/significant other Available Help at Discharge: Family  Type of Home: House Home Access: Stairs to enter [employer] of Steps: 1 Entrance Stairs-Rails: None Home Layout: Two level;Able to live on main level with bedroom/bathroom     Bathroom Shower/Tub: Walk-in shower;Door;Tub/shower unit         Home equipment: None        Prior Functioning/Environment Level of Independence: Needs assistance    ADL's / Homemaking Assistance Needed: assist cutting toenails        OT Diagnosis: Acute pain   OT Problem List:     OT Treatment/Interventions:       OT Goals(Current goals can be found in the care plan section)   OT Frequency:     Barriers to D/C:            Co-evaluation              End of Session Equipment Utilized During Treatment: Rolling walker;Other (comment) (AE)  Activity Tolerance: Patient tolerated treatment well Patient left: in bed;with family/visitor present   Time: 4098-1191 OT Time Calculation (min): 18 min Charges:  OT General Charges $OT Visit: 1 Procedure OT Evaluation $OT Eval Low Complexity: 1 Procedure G-CodesEarlie Raveling OTR/L [phone] 04/30/2015, 12:18 PM

## 2015-04-30 NOTE — Evaluation (Signed)
Physical Therapy Evaluation and Discharge Patient Details Name: Dakota Carrillo MRN: [id number] DOB: 12-02-1960 Today's Date: 04/30/2015   History of Present Illness  Admitted for RTHA, Direct Ant Approach;  has a past medical history of Hypertension; Arthritis; Chronic leukopenia; and Chronic idiopathic thrombocytopenia ([place]). has pertinent past surgical history that includes Shoulder arthroscopy w/ rotator cuff repair (Right, Nov 2015); and Shoulder arthroscopy w/ rotator cuff repair (Left).  Clinical Impression   Patient evaluated by Physical Therapy with no further acute PT needs identified, as he is to dc home today; Managing quite well with mobiilty. All education has been completed and the patient has no further questions.  See below for any follow-up Physical Therapy or equipment needs. Acute PT is signing off. Thank you for this referral.     Follow Up Recommendations Home health PT;Supervision - Intermittent (HHPT can also address shower access questions)    Equipment Recommendations  Rolling walker with 5" wheels;3in1 (PT) (Tall)    Recommendations for Other Services       Precautions / Restrictions Precautions Precautions: None Restrictions Weight Bearing Restrictions: Yes RLE Weight Bearing: Weight bearing as tolerated      Mobility  Bed Mobility Overal bed mobility: Independent                Transfers Overall transfer level: Modified independent Equipment used: Rolling walker (2 wheeled)             General transfer comment: Managing quite well  Ambulation/Gait Ambulation/Gait assistance: Modified independent (Device/Increase time) Ambulation Distance (Feet): 400 Feet Assistive device: Rolling walker (2 wheeled) Gait Pattern/deviations: Step-through pattern     General Gait Details: Cues for more R hip stability in stance; very nice  Stairs Stairs: Yes Stairs assistance: Supervision Stair Management: One rail Right;Step to  pattern;Forwards Number of Stairs: 5 General stair comments: Cues for sequence; managing well  Wheelchair Mobility    Modified Rankin (Stroke Patients Only)       Balance Overall balance assessment: No apparent balance deficits (not formally assessed)                                           Pertinent Vitals/Pain Pain Assessment: 0-10 Pain Score: 4  Pain Location: R hip Pain Descriptors / Indicators: Aching Pain Intervention(s): Monitored during session;Patient requesting pain meds-RN notified;Repositioned    Home Living Family/patient expects to be discharged to:: Private residence Living Arrangements: Spouse/significant other Available Help at Discharge: Family;Available PRN/intermittently Type of Home: House Home Access: Stairs to enter Entrance Stairs-Rails: None Entrance Stairs-Number of Steps: 1 Home Layout: Two level;Able to live on main level with bedroom/bathroom Home Equipment: None (he may already have a shower seat) Additional Comments: Very well-versed in preop education    Prior Function Level of Independence: Independent               Hand Dominance        Extremity/Trunk Assessment   Upper Extremity Assessment: Overall WFL for tasks assessed           Lower Extremity Assessment: RLE deficits/detail;Overall WFL for tasks assessed RLE Deficits / Details: Able to complete a round of ROM therex without assist    Cervical / Trunk Assessment: Normal  Communication   Communication: No difficulties  Cognition Arousal/Alertness: Awake/alert Behavior During Therapy: WFL for tasks assessed/performed Overall Cognitive Status: Within Functional Limits for tasks assessed  General Comments      Exercises Total Joint Exercises Quad Sets: AROM;Right;20 reps Gluteal Sets: AROM;Both;10 reps Heel Slides: AROM;Right;20 reps Hip ABduction/ADduction: AROM;Right;10 reps Bridges: AROM;Both;10 reps       Assessment/Plan    PT Assessment All further PT needs can be met in the next venue of care  PT Diagnosis Difficulty walking;Acute pain   PT Problem List Decreased strength;Decreased range of motion;Decreased mobility;Decreased knowledge of use of DME;Pain;Decreased knowledge of precautions  PT Treatment Interventions     PT Goals (Current goals can be found in the Care Plan section) Acute Rehab PT Goals Patient Stated Goal: get back to better quality of life PT Goal Formulation: All assessment and education complete, DC therapy    Frequency     Barriers to discharge        Co-evaluation               End of Session   Activity Tolerance: Patient tolerated treatment well Patient left: in bed;with call bell/phone within reach Nurse Communication: Mobility status;Patient requests pain meds         Time: 0447-1580 PT Time Calculation (min) (ACUTE ONLY): 31 min   Charges:   PT Evaluation $PT Eval Low Complexity: 1 Procedure PT Treatments $Gait Training: 8-22 mins   PT G Codes:        Roney Marion Hamff 04/30/2015, 11:39 AM  Roney Marion, PT  Acute Rehabilitation Services Pager [phone] Office [phone]

## 2015-04-30 NOTE — Discharge Summary (Signed)
Physician Discharge Summary  Patient ID: Dakota Carrillo MRN: [id number] DOB/AGE: 56-Jul-1962 56 y.o.  Admit date: 04/29/2015 Discharge date: 04/30/2015  Admission Diagnoses:  Primary localized osteoarthritis of right hip  Discharge Diagnoses:  Principal Problem:   Primary localized osteoarthritis of right hip   Past Medical History  Diagnosis Date  . Hypertension   . Arthritis   . Chronic leukopenia   . Chronic idiopathic thrombocytopenia (HCC)     Surgeries: Procedure(s): TOTAL RIGHT HIP ARTHROPLASTY ANTERIOR APPROACH on 04/29/2015   Consultants (if any):    Discharged Condition: Improved  Hospital Course: Dakota Carrillo is an 55 y.o. male who was admitted 04/29/2015 with a diagnosis of Primary localized osteoarthritis of right hip and went to the operating room on 04/29/2015 and underwent the above named procedures.    He was given perioperative antibiotics:  Anti-infectives    Start     Dose/Rate Route Frequency Ordered Stop   04/29/15 2000  ceFAZolin (ANCEF) IVPB 2 g/50 mL premix     2 g 100 mL/hr over 30 Minutes Intravenous Every 6 hours 04/29/15 1934 04/30/15 0247   04/29/15 1000  ceFAZolin (ANCEF) IVPB 2 g/50 mL premix     2 g 100 mL/hr over 30 Minutes Intravenous To ShortStay Surgical 04/28/15 1130 04/29/15 1040   04/29/15 0727  ceFAZolin (ANCEF) 2-3 GM-% IVPB SOLR    Comments:  Edwina Barth   : cabinet override      04/29/15 0727 04/29/15 1929    .  He was given sequential compression devices, early ambulation, and ASA  for DVT prophylaxis.  He benefited maximally from the hospital stay and there were no complications.    Recent vital signs:  Filed Vitals:   04/30/15 0042 04/30/15 0501  BP: 107/60 107/61  Pulse: 75 72  Temp: 98.8 F (37.1 C) 98.8 F (37.1 C)  Resp: 16 16    Recent laboratory studies:  Lab Results  Component Value Date   HGB 12.0* 04/18/2015   Lab Results  Component Value Date   WBC 2.1* 04/18/2015   PLT 190  04/18/2015   Lab Results  Component Value Date   INR 1.14 04/18/2015   Lab Results  Component Value Date   NA 141 04/18/2015   K 3.9 04/18/2015   CL 108 04/18/2015   CO2 27 04/18/2015   BUN 9 04/18/2015   CREATININE 0.76 04/18/2015   GLUCOSE 93 04/18/2015    Discharge Medications:     Medication List    TAKE these medications        aspirin 325 MG tablet  Take 1 tablet (325 mg total) by mouth daily.     atropine 1 % ophthalmic solution  Place 1 drop into the right eye 3 (three) times daily.     diltiazem 180 MG 24 hr capsule  Commonly known as:  TIAZAC  Take 180 mg by mouth 2 (two) times daily.     docusate sodium 100 MG capsule  Commonly known as:  COLACE  Take 1 capsule (100 mg total) by mouth 2 (two) times daily.     HYDROcodone-acetaminophen 5-325 MG tablet  Commonly known as:  NORCO  Take 1-2 tablets by mouth every 6 (six) hours as needed for moderate pain.     meloxicam 15 MG tablet  Commonly known as:  MOBIC  Take 15 mg by mouth daily as needed for pain.     methocarbamol 500 MG tablet  Commonly known as:  ROBAXIN  Take 1  tablet (500 mg total) by mouth 4 (four) times daily.     ondansetron 4 MG tablet  Commonly known as:  ZOFRAN  Take 1 tablet (4 mg total) by mouth every 8 (eight) hours as needed for nausea or vomiting.     pravastatin 20 MG tablet  Commonly known as:  PRAVACHOL  Take 20 mg by mouth daily.     prednisoLONE sodium phosphate 1 % ophthalmic solution  Commonly known as:  INFLAMASE FORTE  Place 1 drop into the right eye 4 (four) times daily as needed.     quinapril 20 MG tablet  Commonly known as:  ACCUPRIL  Take 20 mg by mouth at bedtime.        Diagnostic Studies: Dg Pelvis Portable  04/29/2015  CLINICAL DATA:  Patient status post right hip arthroplasty. EXAM: PORTABLE PELVIS 1-2 VIEWS COMPARISON:  Earlier same day intraoperative radiographs. FINDINGS: Patient status post right hip arthroplasty. Hardware appears intact. Left  hip joint degenerative changes. Degenerative changes bilateral SI joints. IMPRESSION: Patient status post right hip arthroplasty. Electronically Signed   By: Annia Belt M.D.   On: 04/29/2015 14:41   Dg Hip Operative Unilat With Pelvis Right  04/29/2015  CLINICAL DATA:  Status post right hip replacement today. Initial encounter. EXAM: OPERATIVE RIGHT HIP (WITH PELVIS IF PERFORMED) 2 VIEWS TECHNIQUE: Fluoroscopic spot image(s) were submitted for interpretation post-operatively. COMPARISON:  None. FINDINGS: Two fluoroscopic intraoperative spot views are provided. Images demonstrate a right total hip arthroplasty in place. The device is located. No acute abnormality is identified. IMPRESSION: Right total hip arthroplasty.  No acute finding. Electronically Signed   By: Drusilla Kanner M.D.   On: 04/29/2015 13:34    Disposition: Final discharge disposition not confirmed      Discharge Instructions    Weight bearing as tolerated    Complete by:  As directed   Laterality:  right  Extremity:  Lower           Follow-up Information    Follow up with MURPHY, TIMOTHY D, MD In 10 days.   Specialty:  Orthopedic Surgery   Contact information:   [address] [place] [address] [phone]        Signed: Lynann Bologna 04/30/2015, 7:33 AM Cell [phone]

## 2015-04-30 NOTE — Progress Notes (Signed)
     Subjective:  POD#1 RATH. Patient reports pain as mild to moderate.  Resting comfortably in bed this morning.  No issues overnight.  Will see how the patient mobilizes with PT.  Except he will be appropriate for discharge this afternoon.   Objective:   VITALS:   Filed Vitals:   04/29/15 1900 04/29/15 1952 04/30/15 0042 04/30/15 0501  BP: 120/84 123/70 107/60 107/61  Pulse: 63 71 75 72  Temp: 98 F (36.7 C) 99.8 F (37.7 C) 98.8 F (37.1 C) 98.8 F (37.1 C)  TempSrc:  Oral Oral Oral  Resp: Height:      Weight:      SpO2: 100% 99% 97% 97%    Neurologically intact ABD soft Neurovascular intact Sensation intact distally Intact pulses distally Dorsiflexion/Plantar flexion intact Incision: scant drainage   Lab Results  Component Value Date   WBC 2.1* 04/18/2015   HGB 12.0* 04/18/2015   HCT 36.3* 04/18/2015   MCV 95.5 04/18/2015   PLT 190 04/18/2015   BMET    Component Value Date/Time   NA 141 04/18/2015 1118   K 3.9 04/18/2015 1118   CL 108 04/18/2015 1118   CO2 27 04/18/2015 1118   GLUCOSE 93 04/18/2015 1118   BUN 9 04/18/2015 1118   CREATININE 0.76 04/18/2015 1118   CALCIUM 9.2 04/18/2015 1118   GFRNONAA >60 04/18/2015 1118   GFRAA >60 04/18/2015 1118     Assessment/Plan: 1 Day Post-Op   Principal Problem:   Primary localized osteoarthritis of right hip   Up with therapy WBAT in the RLE ASA  daily for 30 days for DVT prophylaxis Plan for discharge this afternoon as long as mobilizes well with PT.    Lynann Bologna 04/30/2015, 7:31 AM Cell [phone]

## 2015-08-12 DIAGNOSIS — H2512 Age-related nuclear cataract, left eye: Secondary | ICD-10-CM | POA: Diagnosis not present

## 2015-08-12 DIAGNOSIS — H35033 Hypertensive retinopathy, bilateral: Secondary | ICD-10-CM | POA: Diagnosis not present

## 2015-08-12 DIAGNOSIS — Z961 Presence of intraocular lens: Secondary | ICD-10-CM | POA: Diagnosis not present

## 2015-08-12 DIAGNOSIS — H209 Unspecified iridocyclitis: Secondary | ICD-10-CM | POA: Diagnosis not present

## 2015-09-23 DIAGNOSIS — Z961 Presence of intraocular lens: Secondary | ICD-10-CM | POA: Diagnosis not present

## 2015-09-23 DIAGNOSIS — H2512 Age-related nuclear cataract, left eye: Secondary | ICD-10-CM | POA: Diagnosis not present

## 2015-09-23 DIAGNOSIS — H35033 Hypertensive retinopathy, bilateral: Secondary | ICD-10-CM | POA: Diagnosis not present

## 2015-09-23 DIAGNOSIS — H209 Unspecified iridocyclitis: Secondary | ICD-10-CM | POA: Diagnosis not present

## 2015-09-25 DIAGNOSIS — H209 Unspecified iridocyclitis: Secondary | ICD-10-CM | POA: Diagnosis not present

## 2015-10-21 DIAGNOSIS — H2512 Age-related nuclear cataract, left eye: Secondary | ICD-10-CM | POA: Diagnosis not present

## 2015-10-21 DIAGNOSIS — H35033 Hypertensive retinopathy, bilateral: Secondary | ICD-10-CM | POA: Diagnosis not present

## 2015-10-21 DIAGNOSIS — Z961 Presence of intraocular lens: Secondary | ICD-10-CM | POA: Diagnosis not present

## 2015-10-21 DIAGNOSIS — H209 Unspecified iridocyclitis: Secondary | ICD-10-CM | POA: Diagnosis not present

## 2015-11-11 DIAGNOSIS — D709 Neutropenia, unspecified: Secondary | ICD-10-CM | POA: Diagnosis not present

## 2015-11-11 DIAGNOSIS — Z961 Presence of intraocular lens: Secondary | ICD-10-CM | POA: Diagnosis not present

## 2015-11-11 DIAGNOSIS — Z9841 Cataract extraction status, right eye: Secondary | ICD-10-CM | POA: Diagnosis not present

## 2015-11-11 DIAGNOSIS — D696 Thrombocytopenia, unspecified: Secondary | ICD-10-CM | POA: Diagnosis not present

## 2015-11-11 DIAGNOSIS — Z79899 Other long term (current) drug therapy: Secondary | ICD-10-CM | POA: Diagnosis not present

## 2015-11-11 DIAGNOSIS — Z888 Allergy status to other drugs, medicaments and biological substances status: Secondary | ICD-10-CM | POA: Diagnosis not present

## 2015-11-11 DIAGNOSIS — I10 Essential (primary) hypertension: Secondary | ICD-10-CM | POA: Diagnosis not present

## 2015-11-11 DIAGNOSIS — H209 Unspecified iridocyclitis: Secondary | ICD-10-CM | POA: Diagnosis not present

## 2015-11-11 DIAGNOSIS — H269 Unspecified cataract: Secondary | ICD-10-CM | POA: Diagnosis not present

## 2015-12-16 DIAGNOSIS — Z961 Presence of intraocular lens: Secondary | ICD-10-CM | POA: Diagnosis not present

## 2015-12-16 DIAGNOSIS — H2512 Age-related nuclear cataract, left eye: Secondary | ICD-10-CM | POA: Diagnosis not present

## 2015-12-16 DIAGNOSIS — H209 Unspecified iridocyclitis: Secondary | ICD-10-CM | POA: Diagnosis not present

## 2015-12-16 DIAGNOSIS — H35033 Hypertensive retinopathy, bilateral: Secondary | ICD-10-CM | POA: Diagnosis not present

## 2016-02-03 DIAGNOSIS — Z23 Encounter for immunization: Secondary | ICD-10-CM | POA: Diagnosis not present

## 2016-02-10 DIAGNOSIS — H35033 Hypertensive retinopathy, bilateral: Secondary | ICD-10-CM | POA: Diagnosis not present

## 2016-02-10 DIAGNOSIS — H209 Unspecified iridocyclitis: Secondary | ICD-10-CM | POA: Diagnosis not present

## 2016-02-10 DIAGNOSIS — Z961 Presence of intraocular lens: Secondary | ICD-10-CM | POA: Diagnosis not present

## 2016-02-10 DIAGNOSIS — H2512 Age-related nuclear cataract, left eye: Secondary | ICD-10-CM | POA: Diagnosis not present

## 2016-03-01 DIAGNOSIS — E785 Hyperlipidemia, unspecified: Secondary | ICD-10-CM | POA: Diagnosis not present

## 2016-03-01 DIAGNOSIS — Z125 Encounter for screening for malignant neoplasm of prostate: Secondary | ICD-10-CM | POA: Diagnosis not present

## 2016-03-01 DIAGNOSIS — I10 Essential (primary) hypertension: Secondary | ICD-10-CM | POA: Diagnosis not present

## 2016-03-01 DIAGNOSIS — Z0000 Encounter for general adult medical examination without abnormal findings: Secondary | ICD-10-CM | POA: Diagnosis not present

## 2016-05-05 DIAGNOSIS — H5213 Myopia, bilateral: Secondary | ICD-10-CM | POA: Diagnosis not present

## 2016-05-05 DIAGNOSIS — H524 Presbyopia: Secondary | ICD-10-CM | POA: Diagnosis not present

## 2016-05-26 DIAGNOSIS — Z96641 Presence of right artificial hip joint: Secondary | ICD-10-CM | POA: Diagnosis not present

## 2016-06-02 DIAGNOSIS — I10 Essential (primary) hypertension: Secondary | ICD-10-CM | POA: Diagnosis not present

## 2016-09-23 DIAGNOSIS — I10 Essential (primary) hypertension: Secondary | ICD-10-CM | POA: Diagnosis not present

## 2016-09-23 DIAGNOSIS — E785 Hyperlipidemia, unspecified: Secondary | ICD-10-CM | POA: Diagnosis not present

## 2016-09-23 DIAGNOSIS — N400 Enlarged prostate without lower urinary tract symptoms: Secondary | ICD-10-CM | POA: Diagnosis not present

## 2016-09-23 DIAGNOSIS — M159 Polyosteoarthritis, unspecified: Secondary | ICD-10-CM | POA: Diagnosis not present

## 2016-09-27 DIAGNOSIS — E785 Hyperlipidemia, unspecified: Secondary | ICD-10-CM | POA: Diagnosis not present

## 2016-09-27 DIAGNOSIS — M130 Polyarthritis, unspecified: Secondary | ICD-10-CM | POA: Diagnosis not present

## 2016-09-27 DIAGNOSIS — I10 Essential (primary) hypertension: Secondary | ICD-10-CM | POA: Diagnosis not present

## 2016-09-27 DIAGNOSIS — D72819 Decreased white blood cell count, unspecified: Secondary | ICD-10-CM | POA: Diagnosis not present

## 2016-10-11 DIAGNOSIS — R05 Cough: Secondary | ICD-10-CM | POA: Diagnosis not present

## 2016-10-11 DIAGNOSIS — R6883 Chills (without fever): Secondary | ICD-10-CM | POA: Diagnosis not present

## 2016-10-11 DIAGNOSIS — D72819 Decreased white blood cell count, unspecified: Secondary | ICD-10-CM | POA: Diagnosis not present

## 2016-11-23 ENCOUNTER — Emergency Department (HOSPITAL_COMMUNITY): Payer: BLUE CROSS/BLUE SHIELD

## 2016-11-23 ENCOUNTER — Encounter (HOSPITAL_COMMUNITY): Payer: Self-pay | Admitting: *Deleted

## 2016-11-23 ENCOUNTER — Emergency Department (HOSPITAL_COMMUNITY)
Admission: EM | Admit: 2016-11-23 | Discharge: 2016-11-24 | Disposition: A | Discharge status: Home / Self Care | Payer: BLUE CROSS/BLUE SHIELD | Attending: Emergency Medicine | Admitting: Emergency Medicine

## 2016-11-23 DIAGNOSIS — Z7982 Long term (current) use of aspirin: Secondary | ICD-10-CM | POA: Insufficient documentation

## 2016-11-23 DIAGNOSIS — I10 Essential (primary) hypertension: Secondary | ICD-10-CM | POA: Insufficient documentation

## 2016-11-23 DIAGNOSIS — Z79899 Other long term (current) drug therapy: Secondary | ICD-10-CM | POA: Insufficient documentation

## 2016-11-23 DIAGNOSIS — Z87891 Personal history of nicotine dependence: Secondary | ICD-10-CM | POA: Diagnosis not present

## 2016-11-23 DIAGNOSIS — R509 Fever, unspecified: Secondary | ICD-10-CM | POA: Diagnosis not present

## 2016-11-23 DIAGNOSIS — D696 Thrombocytopenia, unspecified: Secondary | ICD-10-CM | POA: Diagnosis not present

## 2016-11-23 DIAGNOSIS — R0789 Other chest pain: Secondary | ICD-10-CM | POA: Diagnosis not present

## 2016-11-23 LAB — COMPREHENSIVE METABOLIC PANEL
ALT: 24 U/L (ref 17–63)
ANION GAP: 10 (ref 5–15)
AST: 35 U/L (ref 15–41)
Albumin: 3.8 g/dL (ref 3.5–5.0)
Alkaline Phosphatase: 64 U/L (ref 38–126)
BILIRUBIN TOTAL: 0.7 mg/dL (ref 0.3–1.2)
BUN: 12 mg/dL (ref 6–20)
CO2: 23 mmol/L (ref 22–32)
Calcium: 8.3 mg/dL — ABNORMAL LOW (ref 8.9–10.3)
Chloride: 96 mmol/L — ABNORMAL LOW (ref 101–111)
Creatinine, Ser: 1.12 mg/dL (ref 0.61–1.24)
GFR calc Af Amer: >60 mL/min (ref >60)
Glucose, Bld: 113 mg/dL — ABNORMAL HIGH (ref 65–99)
POTASSIUM: 3.1 mmol/L — AB (ref 3.5–5.1)
Sodium: 129 mmol/L — ABNORMAL LOW (ref 135–145)
TOTAL PROTEIN: 7.8 g/dL (ref 6.5–8.1)

## 2016-11-23 LAB — URINALYSIS, ROUTINE W REFLEX MICROSCOPIC
Bacteria, UA: NONE SEEN
Bilirubin Urine: NEGATIVE
Glucose, UA: NEGATIVE mg/dL
Hgb urine dipstick: NEGATIVE
Ketones, ur: 20 mg/dL — AB
Leukocytes, UA: NEGATIVE
Nitrite: NEGATIVE
PH: 5 (ref 5.0–8.0)
Protein, ur: 30 mg/dL — AB
SPECIFIC GRAVITY, URINE: 1.029 (ref 1.005–1.030)
SQUAMOUS EPITHELIAL / LPF: NONE SEEN

## 2016-11-23 LAB — I-STAT CG4 LACTIC ACID, ED
LACTIC ACID, VENOUS: 2.05 mmol/L — AB (ref 0.5–1.9)
Lactic Acid, Venous: 0.85 mmol/L (ref 0.5–1.9)

## 2016-11-23 LAB — CBC WITH DIFFERENTIAL/PLATELET
BASOS PCT: 1 %
Basophils Absolute: 0 10*3/uL (ref 0.0–0.1)
EOS ABS: 0 10*3/uL (ref 0.0–0.7)
Eosinophils Relative: 0 %
HCT: 39 % (ref 39.0–52.0)
Hemoglobin: 13.7 g/dL (ref 13.0–17.0)
Lymphocytes Relative: 35 %
Lymphs Abs: 1.3 10*3/uL (ref 0.7–4.0)
MCH: 31.7 pg (ref 26.0–34.0)
MCHC: 35.1 g/dL (ref 30.0–36.0)
MCV: 90.3 fL (ref 78.0–100.0)
MONOS PCT: 10 %
Monocytes Absolute: 0.4 10*3/uL (ref 0.1–1.0)
NEUTROS PCT: 54 %
Neutro Abs: 2 10*3/uL (ref 1.7–7.7)
PLATELETS: 136 10*3/uL — AB (ref 150–400)
RBC: 4.32 MIL/uL (ref 4.22–5.81)
RDW: 12.7 % (ref 11.5–15.5)
WBC: 3.8 10*3/uL — ABNORMAL LOW (ref 4.0–10.5)

## 2016-11-23 LAB — PROTIME-INR
INR: 1.22
PROTHROMBIN TIME: 15.5 s — AB (ref 11.4–15.2)

## 2016-11-23 MED ORDER — SODIUM CHLORIDE 0.9 % IV BOLUS (SEPSIS)
1000.0000 mL | Freq: Once | INTRAVENOUS | Status: AC
  Administered 2016-11-23: 1000 mL via INTRAVENOUS

## 2016-11-23 MED ORDER — ACETAMINOPHEN 500 MG PO TABS
1000.0000 mg | ORAL_TABLET | Freq: Once | ORAL | Status: AC
  Administered 2016-11-23: 1000 mg via ORAL
  Filled 2016-11-23: qty 2

## 2016-11-23 MED ORDER — DOXYCYCLINE HYCLATE 100 MG PO CAPS: 100.0000 mg | ORAL_CAPSULE | Freq: Two times a day (BID) | ORAL | 0 refills | Status: AC

## 2016-11-23 NOTE — ED Provider Notes (Signed)
[facility]-EMERGENCY DEPT Provider Note   CSN: [id number] Arrival date & time: 11/23/16  1954     History   Chief Complaint Chief Complaint  Patient presents with  . Fever    HPI Steele BergLincoln J Force is a 56 y.o. male.  Patient presents to the ED with a chief complaint of fever and generalized body aches.  He states that his symptoms started on Sunday night.  He reports associated chills and joint pains.  He reports that he has been very fatigued and sleeping more.  He reports some soreness in the right side of his chest, but denies any central chest pain or SOB.  He denies any abdominal pain, nausea, or vomiting, but does report one episode of diarrhea.  He denies any tick or bug bites.  Denies any rash.  He states that he was seen by his PCP and was instructed to come to the ER due to elevated temperature.    The history is provided by the patient. No language interpreter was used.    Past Medical History:  Diagnosis Date  . Arthritis   . Chronic idiopathic thrombocytopenia (HCC)   . Chronic leukopenia   . Hypertension     Patient Active Problem List   Diagnosis Date Noted  . Primary localized osteoarthritis of right hip 04/29/2015  . HYPERTENSION, BENIGN 05/18/2010  . SUPRAVENTRICULAR TACHYCARDIA 05/18/2010    Past Surgical History:  Procedure Laterality Date  . COLONOSCOPY    . EYE SURGERY    . SHOULDER ARTHROSCOPY W/ ROTATOR CUFF REPAIR Right Nov 2015  . SHOULDER ARTHROSCOPY W/ ROTATOR CUFF REPAIR Left    2009  . TOTAL HIP ARTHROPLASTY Right 04/29/2015  . TOTAL HIP ARTHROPLASTY Right 04/29/2015   Procedure: TOTAL RIGHT HIP ARTHROPLASTY ANTERIOR APPROACH;  Surgeon: Sheral Apleyimothy D Murphy, MD;  Location: [facility] OR;  Service: Orthopedics;  Laterality: Right;  . urine growth  1987       Home Medications    Prior to Admission medications   Medication Sig Start Date End Date Taking? Authorizing Provider  aspirin 325 MG tablet Take 1 tablet (325 mg total) by mouth daily. 04/29/15    Janalee DaneKelly, Brittney, PA-C  atropine 1 % ophthalmic solution Place 1 drop into the right eye 3 (three) times daily.    [provider]  diltiazem (TIAZAC) 180 MG 24 hr capsule Take 180 mg by mouth 2 (two) times daily.    [provider]  docusate sodium (COLACE) 100 MG capsule Take 1 capsule (100 mg total) by mouth 2 (two) times daily. 04/29/15   Janalee DaneKelly, Brittney, PA-C  HYDROcodone-acetaminophen (NORCO) 5-325 MG tablet Take 1-2 tablets by mouth every 6 (six) hours as needed for moderate pain. 04/29/15   Janalee DaneKelly, Brittney, PA-C  meloxicam (MOBIC) 15 MG tablet Take 15 mg by mouth daily as needed for pain.    [provider]  methocarbamol (ROBAXIN) 500 MG tablet Take 1 tablet (500 mg total) by mouth 4 (four) times daily. 04/29/15   Janalee DaneKelly, Brittney, PA-C  ondansetron (ZOFRAN) 4 MG tablet Take 1 tablet (4 mg total) by mouth every 8 (eight) hours as needed for nausea or vomiting. 04/29/15   Janalee DaneKelly, Brittney, PA-C  pravastatin (PRAVACHOL) 20 MG tablet Take 20 mg by mouth daily.    [provider]  prednisoLONE sodium phosphate (INFLAMASE FORTE) 1 % ophthalmic solution Place 1 drop into the right eye 4 (four) times daily as needed.    [provider]  quinapril (ACCUPRIL) 20 MG tablet Take  20 mg by mouth at bedtime.    [provider]    Family History No family history on file.  Social History Social History  Substance Use Topics  . Smoking status: Former Games developer  . Smokeless tobacco: Never Used     Comment: quit in 1990's  . Alcohol use No     Allergies   Metoprolol   Review of Systems Review of Systems  All other systems reviewed and are negative.    Physical Exam Updated Vital Signs BP 125/78   Pulse 72   Temp (!) 100.9 F (38.3 C) (Oral)   Resp 18   SpO2 99%   Physical Exam  Constitutional: He is oriented to person, place, and time. He appears well-developed and well-nourished.  HENT:  Head: Normocephalic and atraumatic.  Eyes:  Pupils are equal, round, and reactive to light. Conjunctivae and EOM are normal. Right eye exhibits no discharge. Left eye exhibits no discharge. No scleral icterus.  Neck: Normal range of motion. Neck supple. No JVD present.  Cardiovascular: Normal rate, regular rhythm and normal heart sounds.  Exam reveals no gallop and no friction rub.   No murmur heard. Pulmonary/Chest: Effort normal and breath sounds normal. No respiratory distress. He has no wheezes. He has no rales. He exhibits no tenderness.  CTAB  Abdominal: Soft. He exhibits no distension and no mass. There is no tenderness. There is no rebound and no guarding.  No focal abdominal tenderness, no RLQ tenderness or pain at McBurney's point, no RUQ tenderness or Murphy's sign, no left-sided abdominal tenderness, no fluid wave, or signs of peritonitis   Musculoskeletal: Normal range of motion. He exhibits no edema or tenderness.  Neurological: He is alert and oriented to person, place, and time.  Skin: Skin is warm and dry.  No rashes  Psychiatric: He has a normal mood and affect. His behavior is normal. Judgment and thought content normal.  Nursing note and vitals reviewed.    ED Treatments / Results  Labs (all labs ordered are listed, but only abnormal results are displayed) Labs Reviewed  COMPREHENSIVE METABOLIC PANEL - Abnormal; Notable for the following:       Result Value   Sodium 129 (*)    Potassium 3.1 (*)    Chloride 96 (*)    Glucose, Bld 113 (*)    Calcium 8.3 (*)    All other components within normal limits  CBC WITH DIFFERENTIAL/PLATELET - Abnormal; Notable for the following:    WBC 3.8 (*)    Platelets 136 (*)    All other components within normal limits  PROTIME-INR - Abnormal; Notable for the following:    Prothrombin Time 15.5 (*)    All other components within normal limits  I-STAT CG4 LACTIC ACID, ED - Abnormal; Notable for the following:    Lactic Acid, Venous 2.05 (*)    All other components within  normal limits  CULTURE, BLOOD (ROUTINE X 2)  CULTURE, BLOOD (ROUTINE X 2)  URINALYSIS, ROUTINE W REFLEX MICROSCOPIC    EKG  EKG Interpretation None       Radiology Dg Chest 2 View  Result Date: 11/23/2016 CLINICAL DATA:  Chest soreness.  Right-sided soreness for 2 days. EXAM: CHEST  2 VIEW COMPARISON:  Radiographs 06/24/2011 FINDINGS: Lungs are hyperinflated. The cardiomediastinal contours are normal. The lungs are clear. Pulmonary vasculature is normal. No consolidation, pleural effusion, or pneumothorax. No acute osseous abnormalities are seen. IMPRESSION: Chronic hyperinflation.  No new or acute abnormality. Electronically Signed  By: Rubye Oaks M.D.   On: 11/23/2016 20:21    Procedures Procedures (including critical care time)  Medications Ordered in ED Medications  acetaminophen (TYLENOL) tablet 1,000 mg (not administered)  sodium chloride 0.9 % bolus 1,000 mL (not administered)     Initial Impression / Assessment and Plan / ED Course  I have reviewed the triage vital signs and the nursing notes.  Pertinent labs & imaging results that were available during my care of the patient were reviewed by me and considered in my medical decision making (see chart for details).    Patient with fever x 3 days.  Generalized fatigue.  BP, HR, and RR are normal.  Normal O2 sat.  CXR is clear.  UA pending.  Lactate mildly elevated.  Will give fluids and tylenol.  Patient also complains of mild headache, but no objective neck stiffness.  Appears non-toxic.  11:23 PM Patient seen by and discussed with Dr. Criss Alvine, who agrees with plan for discharge and advises giving doxy on the chance of any tick-born illness, but symptoms are thought to be 2/2 summer cold/viral illness.  Final Clinical Impressions(s) / ED Diagnoses   Final diagnoses:  Febrile illness    New Prescriptions New Prescriptions   No medications on file     Roxy Horseman, Cordelia Poche 11/23/16 2354      Pricilla Loveless, MD 11/28/16 1610    Pricilla Loveless, MD 11/28/16 [phone]

## 2016-11-23 NOTE — Discharge Instructions (Signed)
Please follow-up with your doctor in 2-3 days.  If your symptoms worsen please return to the ER.  Take antibiotics as directed.

## 2016-11-23 NOTE — ED Notes (Signed)
Dr. Tegeler notified on pt.'s elevated Lactic Acid result.  

## 2016-11-23 NOTE — ED Triage Notes (Signed)
Pt sent [facility] for [facility] work[facility]. Pt reports generalized body aches and fever x4 days. Per notes sent from the clinic pt has had stiff neck and headaches as well. Last took tylenol at1300

## 2016-11-24 MED ORDER — IBUPROFEN 200 MG PO TABS
600.0000 mg | ORAL_TABLET | Freq: Once | ORAL | Status: AC
  Administered 2016-11-24: 600 mg via ORAL
  Filled 2016-11-24: qty 1

## 2016-11-24 NOTE — ED Notes (Signed)
Patient requesting something for his headache prior to being discharged. Will notify MD.

## 2016-11-24 NOTE — ED Notes (Signed)
Patient verbalized understanding of discharge instructions and denies any further needs or questions at this time. VS stable. Patient ambulatory with steady gait. Escorted to ED entrance in wheelchair.   

## 2016-11-25 LAB — B. BURGDORFI ANTIBODIES: B burgdorferi Ab IgG+IgM: <0.91 {ISR} (ref 0.00–0.90)

## 2016-11-26 LAB — ROCKY MTN SPOTTED FVR ABS PNL(IGG+IGM)
RMSF IGG: NEGATIVE
RMSF IgM: 0.16 index (ref 0.00–0.89)

## 2016-11-28 LAB — CULTURE, BLOOD (ROUTINE X 2)
CULTURE: NO GROWTH
Culture: NO GROWTH
SPECIAL REQUESTS: ADEQUATE
Special Requests: ADEQUATE

## 2016-11-29 DIAGNOSIS — R509 Fever, unspecified: Secondary | ICD-10-CM | POA: Diagnosis not present

## 2016-12-06 DIAGNOSIS — R509 Fever, unspecified: Secondary | ICD-10-CM | POA: Diagnosis not present

## 2016-12-20 DIAGNOSIS — D72829 Elevated white blood cell count, unspecified: Secondary | ICD-10-CM | POA: Diagnosis not present

## 2016-12-20 DIAGNOSIS — D696 Thrombocytopenia, unspecified: Secondary | ICD-10-CM | POA: Diagnosis not present

## 2016-12-20 DIAGNOSIS — R509 Fever, unspecified: Secondary | ICD-10-CM | POA: Diagnosis not present

## 2016-12-21 DIAGNOSIS — D72819 Decreased white blood cell count, unspecified: Secondary | ICD-10-CM | POA: Diagnosis not present

## 2016-12-21 DIAGNOSIS — I10 Essential (primary) hypertension: Secondary | ICD-10-CM | POA: Diagnosis not present

## 2017-01-13 DIAGNOSIS — Z23 Encounter for immunization: Secondary | ICD-10-CM | POA: Diagnosis not present

## 2017-03-28 DIAGNOSIS — D72819 Decreased white blood cell count, unspecified: Secondary | ICD-10-CM | POA: Diagnosis not present

## 2017-03-28 DIAGNOSIS — M159 Polyosteoarthritis, unspecified: Secondary | ICD-10-CM | POA: Diagnosis not present

## 2017-03-28 DIAGNOSIS — Z0000 Encounter for general adult medical examination without abnormal findings: Secondary | ICD-10-CM | POA: Diagnosis not present

## 2017-03-28 DIAGNOSIS — I10 Essential (primary) hypertension: Secondary | ICD-10-CM | POA: Diagnosis not present

## 2017-06-03 DIAGNOSIS — Z96641 Presence of right artificial hip joint: Secondary | ICD-10-CM | POA: Diagnosis not present

## 2017-06-08 DIAGNOSIS — I10 Essential (primary) hypertension: Secondary | ICD-10-CM | POA: Diagnosis not present

## 2017-08-24 DIAGNOSIS — M25511 Pain in right shoulder: Secondary | ICD-10-CM | POA: Diagnosis not present

## 2017-08-24 DIAGNOSIS — M542 Cervicalgia: Secondary | ICD-10-CM | POA: Diagnosis not present

## 2017-09-26 DIAGNOSIS — I10 Essential (primary) hypertension: Secondary | ICD-10-CM | POA: Diagnosis not present

## 2017-09-26 DIAGNOSIS — M159 Polyosteoarthritis, unspecified: Secondary | ICD-10-CM | POA: Diagnosis not present

## 2017-09-26 DIAGNOSIS — D696 Thrombocytopenia, unspecified: Secondary | ICD-10-CM | POA: Diagnosis not present

## 2017-09-26 DIAGNOSIS — D72819 Decreased white blood cell count, unspecified: Secondary | ICD-10-CM | POA: Diagnosis not present

## 2018-02-16 DIAGNOSIS — Z23 Encounter for immunization: Secondary | ICD-10-CM | POA: Diagnosis not present

## 2018-03-08 ENCOUNTER — Ambulatory Visit
Admission: RE | Admit: 2018-03-08 | Discharge: 2018-03-08 | Disposition: A | Discharge status: Home / Self Care | Payer: BLUE CROSS/BLUE SHIELD | Source: Ambulatory Visit | Attending: Family Medicine | Admitting: Family Medicine

## 2018-03-08 ENCOUNTER — Other Ambulatory Visit: Payer: Self-pay

## 2018-03-08 DIAGNOSIS — M542 Cervicalgia: Secondary | ICD-10-CM

## 2018-03-08 DIAGNOSIS — Z6829 Body mass index (BMI) 29.0-29.9, adult: Secondary | ICD-10-CM | POA: Diagnosis not present

## 2018-03-23 DIAGNOSIS — I10 Essential (primary) hypertension: Secondary | ICD-10-CM | POA: Diagnosis not present

## 2018-03-23 DIAGNOSIS — D696 Thrombocytopenia, unspecified: Secondary | ICD-10-CM | POA: Diagnosis not present

## 2018-03-23 DIAGNOSIS — Z125 Encounter for screening for malignant neoplasm of prostate: Secondary | ICD-10-CM | POA: Diagnosis not present

## 2018-03-23 DIAGNOSIS — M542 Cervicalgia: Secondary | ICD-10-CM | POA: Diagnosis not present

## 2018-03-27 DIAGNOSIS — Z0000 Encounter for general adult medical examination without abnormal findings: Secondary | ICD-10-CM | POA: Diagnosis not present

## 2018-03-27 DIAGNOSIS — Z6829 Body mass index (BMI) 29.0-29.9, adult: Secondary | ICD-10-CM | POA: Diagnosis not present

## 2018-03-27 DIAGNOSIS — M542 Cervicalgia: Secondary | ICD-10-CM | POA: Diagnosis not present

## 2018-04-13 DIAGNOSIS — M542 Cervicalgia: Secondary | ICD-10-CM | POA: Diagnosis not present

## 2018-04-18 DIAGNOSIS — M542 Cervicalgia: Secondary | ICD-10-CM | POA: Diagnosis not present

## 2018-04-20 DIAGNOSIS — M542 Cervicalgia: Secondary | ICD-10-CM | POA: Diagnosis not present

## 2018-04-25 DIAGNOSIS — M542 Cervicalgia: Secondary | ICD-10-CM | POA: Diagnosis not present

## 2018-04-27 DIAGNOSIS — M542 Cervicalgia: Secondary | ICD-10-CM | POA: Diagnosis not present

## 2018-05-01 DIAGNOSIS — M542 Cervicalgia: Secondary | ICD-10-CM | POA: Diagnosis not present

## 2018-05-04 DIAGNOSIS — M542 Cervicalgia: Secondary | ICD-10-CM | POA: Diagnosis not present

## 2018-05-09 DIAGNOSIS — M542 Cervicalgia: Secondary | ICD-10-CM | POA: Diagnosis not present

## 2018-05-11 DIAGNOSIS — M542 Cervicalgia: Secondary | ICD-10-CM | POA: Diagnosis not present

## 2018-08-30 DIAGNOSIS — Z23 Encounter for immunization: Secondary | ICD-10-CM | POA: Diagnosis not present

## 2018-11-20 DIAGNOSIS — D72819 Decreased white blood cell count, unspecified: Secondary | ICD-10-CM | POA: Diagnosis not present

## 2018-11-20 DIAGNOSIS — Z7189 Other specified counseling: Secondary | ICD-10-CM | POA: Diagnosis not present

## 2018-11-20 DIAGNOSIS — M130 Polyarthritis, unspecified: Secondary | ICD-10-CM | POA: Diagnosis not present

## 2018-11-20 DIAGNOSIS — I10 Essential (primary) hypertension: Secondary | ICD-10-CM | POA: Diagnosis not present

## 2019-02-03 ENCOUNTER — Other Ambulatory Visit: Payer: Self-pay

## 2019-02-03 DIAGNOSIS — Z20822 Contact with and (suspected) exposure to covid-19: Secondary | ICD-10-CM

## 2019-02-05 LAB — NOVEL CORONAVIRUS, NAA: SARS-CoV-2, NAA: NOT DETECTED

## 2019-03-06 ENCOUNTER — Other Ambulatory Visit: Payer: Self-pay | Admitting: Cardiology

## 2019-03-06 DIAGNOSIS — Z20822 Contact with and (suspected) exposure to covid-19: Secondary | ICD-10-CM

## 2019-03-08 LAB — NOVEL CORONAVIRUS, NAA: SARS-CoV-2, NAA: NOT DETECTED

## 2019-03-20 DIAGNOSIS — D72819 Decreased white blood cell count, unspecified: Secondary | ICD-10-CM | POA: Diagnosis not present

## 2019-03-20 DIAGNOSIS — I10 Essential (primary) hypertension: Secondary | ICD-10-CM | POA: Diagnosis not present

## 2019-03-20 DIAGNOSIS — Z125 Encounter for screening for malignant neoplasm of prostate: Secondary | ICD-10-CM | POA: Diagnosis not present

## 2019-04-02 ENCOUNTER — Other Ambulatory Visit: Payer: Self-pay

## 2019-04-02 DIAGNOSIS — Z20822 Contact with and (suspected) exposure to covid-19: Secondary | ICD-10-CM

## 2019-04-02 DIAGNOSIS — Z20828 Contact with and (suspected) exposure to other viral communicable diseases: Secondary | ICD-10-CM | POA: Diagnosis not present

## 2019-04-03 LAB — NOVEL CORONAVIRUS, NAA: SARS-CoV-2, NAA: NOT DETECTED

## 2019-04-04 DIAGNOSIS — Z7189 Other specified counseling: Secondary | ICD-10-CM | POA: Diagnosis not present

## 2019-04-04 DIAGNOSIS — D72818 Other decreased white blood cell count: Secondary | ICD-10-CM | POA: Diagnosis not present

## 2019-04-04 DIAGNOSIS — I10 Essential (primary) hypertension: Secondary | ICD-10-CM | POA: Diagnosis not present

## 2019-04-04 DIAGNOSIS — M159 Polyosteoarthritis, unspecified: Secondary | ICD-10-CM | POA: Diagnosis not present

## 2019-04-28 ENCOUNTER — Other Ambulatory Visit: Payer: Self-pay

## 2019-04-28 DIAGNOSIS — Z20822 Contact with and (suspected) exposure to covid-19: Secondary | ICD-10-CM | POA: Diagnosis not present

## 2019-04-29 LAB — NOVEL CORONAVIRUS, NAA: SARS-CoV-2, NAA: NOT DETECTED

## 2019-05-28 DIAGNOSIS — U071 COVID-19: Secondary | ICD-10-CM | POA: Diagnosis not present

## 2019-05-28 DIAGNOSIS — H3021 Posterior cyclitis, right eye: Secondary | ICD-10-CM | POA: Diagnosis not present

## 2019-05-28 DIAGNOSIS — H35033 Hypertensive retinopathy, bilateral: Secondary | ICD-10-CM | POA: Diagnosis not present

## 2019-05-28 DIAGNOSIS — H2513 Age-related nuclear cataract, bilateral: Secondary | ICD-10-CM | POA: Diagnosis not present

## 2019-05-28 DIAGNOSIS — Z79899 Other long term (current) drug therapy: Secondary | ICD-10-CM | POA: Diagnosis not present

## 2019-05-28 DIAGNOSIS — H33021 Retinal detachment with multiple breaks, right eye: Secondary | ICD-10-CM | POA: Diagnosis not present

## 2019-05-28 DIAGNOSIS — E782 Mixed hyperlipidemia: Secondary | ICD-10-CM | POA: Diagnosis not present

## 2019-05-28 DIAGNOSIS — H33011 Retinal detachment with single break, right eye: Secondary | ICD-10-CM | POA: Diagnosis not present

## 2019-05-29 DIAGNOSIS — U071 COVID-19: Secondary | ICD-10-CM | POA: Diagnosis not present

## 2019-05-29 DIAGNOSIS — H33021 Retinal detachment with multiple breaks, right eye: Secondary | ICD-10-CM | POA: Diagnosis not present

## 2019-05-29 DIAGNOSIS — Z79899 Other long term (current) drug therapy: Secondary | ICD-10-CM | POA: Diagnosis not present

## 2019-05-29 DIAGNOSIS — I10 Essential (primary) hypertension: Secondary | ICD-10-CM | POA: Diagnosis not present

## 2019-05-29 DIAGNOSIS — I4891 Unspecified atrial fibrillation: Secondary | ICD-10-CM | POA: Diagnosis not present

## 2019-05-29 DIAGNOSIS — H33011 Retinal detachment with single break, right eye: Secondary | ICD-10-CM | POA: Diagnosis not present

## 2019-05-29 DIAGNOSIS — E782 Mixed hyperlipidemia: Secondary | ICD-10-CM | POA: Diagnosis not present

## 2019-06-09 ENCOUNTER — Other Ambulatory Visit: Payer: Self-pay

## 2019-06-09 DIAGNOSIS — Z20822 Contact with and (suspected) exposure to covid-19: Secondary | ICD-10-CM | POA: Diagnosis not present

## 2019-06-11 LAB — NOVEL CORONAVIRUS, NAA: SARS-CoV-2, NAA: NOT DETECTED

## 2019-06-12 DIAGNOSIS — Z20828 Contact with and (suspected) exposure to other viral communicable diseases: Secondary | ICD-10-CM | POA: Diagnosis not present

## 2019-06-12 DIAGNOSIS — I10 Essential (primary) hypertension: Secondary | ICD-10-CM | POA: Diagnosis not present

## 2019-06-20 DIAGNOSIS — H33011 Retinal detachment with single break, right eye: Secondary | ICD-10-CM | POA: Diagnosis not present

## 2019-06-28 DIAGNOSIS — Z9889 Other specified postprocedural states: Secondary | ICD-10-CM | POA: Diagnosis not present

## 2019-06-28 DIAGNOSIS — N529 Male erectile dysfunction, unspecified: Secondary | ICD-10-CM | POA: Diagnosis not present

## 2019-06-28 DIAGNOSIS — I10 Essential (primary) hypertension: Secondary | ICD-10-CM | POA: Diagnosis not present

## 2019-07-06 DIAGNOSIS — I10 Essential (primary) hypertension: Secondary | ICD-10-CM | POA: Diagnosis not present

## 2019-07-06 DIAGNOSIS — E785 Hyperlipidemia, unspecified: Secondary | ICD-10-CM | POA: Diagnosis not present

## 2019-07-11 DIAGNOSIS — H33011 Retinal detachment with single break, right eye: Secondary | ICD-10-CM | POA: Diagnosis not present

## 2019-07-20 DIAGNOSIS — H40051 Ocular hypertension, right eye: Secondary | ICD-10-CM | POA: Diagnosis not present

## 2019-07-20 DIAGNOSIS — Z79899 Other long term (current) drug therapy: Secondary | ICD-10-CM | POA: Diagnosis not present

## 2019-08-06 DIAGNOSIS — H40051 Ocular hypertension, right eye: Secondary | ICD-10-CM | POA: Diagnosis not present

## 2019-08-09 DIAGNOSIS — H33011 Retinal detachment with single break, right eye: Secondary | ICD-10-CM | POA: Diagnosis not present

## 2019-08-13 DIAGNOSIS — H5213 Myopia, bilateral: Secondary | ICD-10-CM | POA: Diagnosis not present

## 2019-08-13 DIAGNOSIS — H524 Presbyopia: Secondary | ICD-10-CM | POA: Diagnosis not present

## 2019-08-15 DIAGNOSIS — H33011 Retinal detachment with single break, right eye: Secondary | ICD-10-CM | POA: Diagnosis not present

## 2019-08-22 DIAGNOSIS — H33011 Retinal detachment with single break, right eye: Secondary | ICD-10-CM | POA: Diagnosis not present

## 2019-09-04 DIAGNOSIS — H3021 Posterior cyclitis, right eye: Secondary | ICD-10-CM | POA: Diagnosis not present

## 2019-09-04 DIAGNOSIS — H35033 Hypertensive retinopathy, bilateral: Secondary | ICD-10-CM | POA: Diagnosis not present

## 2019-09-04 DIAGNOSIS — H33011 Retinal detachment with single break, right eye: Secondary | ICD-10-CM | POA: Diagnosis not present

## 2019-09-04 DIAGNOSIS — H40051 Ocular hypertension, right eye: Secondary | ICD-10-CM | POA: Diagnosis not present

## 2019-09-05 DIAGNOSIS — H33011 Retinal detachment with single break, right eye: Secondary | ICD-10-CM | POA: Diagnosis not present

## 2019-09-06 DIAGNOSIS — H40051 Ocular hypertension, right eye: Secondary | ICD-10-CM | POA: Diagnosis not present

## 2019-09-06 DIAGNOSIS — Z79899 Other long term (current) drug therapy: Secondary | ICD-10-CM | POA: Diagnosis not present

## 2019-10-01 DIAGNOSIS — H33011 Retinal detachment with single break, right eye: Secondary | ICD-10-CM | POA: Diagnosis not present

## 2019-10-01 DIAGNOSIS — H40051 Ocular hypertension, right eye: Secondary | ICD-10-CM | POA: Diagnosis not present

## 2019-10-08 DIAGNOSIS — D72819 Decreased white blood cell count, unspecified: Secondary | ICD-10-CM | POA: Diagnosis not present

## 2019-10-08 DIAGNOSIS — I10 Essential (primary) hypertension: Secondary | ICD-10-CM | POA: Diagnosis not present

## 2019-10-08 DIAGNOSIS — H33011 Retinal detachment with single break, right eye: Secondary | ICD-10-CM | POA: Diagnosis not present

## 2019-10-08 DIAGNOSIS — M159 Polyosteoarthritis, unspecified: Secondary | ICD-10-CM | POA: Diagnosis not present
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
## 2020-11-12 DIAGNOSIS — H33011 Retinal detachment with single break, right eye: Secondary | ICD-10-CM | POA: Diagnosis not present

## 2020-11-12 DIAGNOSIS — H40051 Ocular hypertension, right eye: Secondary | ICD-10-CM | POA: Diagnosis not present

## 2020-11-12 DIAGNOSIS — Z79899 Other long term (current) drug therapy: Secondary | ICD-10-CM | POA: Diagnosis not present

## 2020-11-12 DIAGNOSIS — H3021 Posterior cyclitis, right eye: Secondary | ICD-10-CM | POA: Diagnosis not present

## 2020-11-12 DIAGNOSIS — H35033 Hypertensive retinopathy, bilateral: Secondary | ICD-10-CM | POA: Diagnosis not present

## 2020-12-02 DIAGNOSIS — Z20822 Contact with and (suspected) exposure to covid-19: Secondary | ICD-10-CM | POA: Diagnosis not present

## 2020-12-04 DIAGNOSIS — Z20822 Contact with and (suspected) exposure to covid-19: Secondary | ICD-10-CM | POA: Diagnosis not present

## 2020-12-19 DIAGNOSIS — H4053X0 Glaucoma secondary to other eye disorders, bilateral, stage unspecified: Secondary | ICD-10-CM | POA: Diagnosis not present

## 2020-12-19 DIAGNOSIS — Z79899 Other long term (current) drug therapy: Secondary | ICD-10-CM | POA: Diagnosis not present

## 2020-12-19 DIAGNOSIS — H40053 Ocular hypertension, bilateral: Secondary | ICD-10-CM | POA: Diagnosis not present

## 2021-02-10 DIAGNOSIS — H40051 Ocular hypertension, right eye: Secondary | ICD-10-CM | POA: Diagnosis not present

## 2021-02-10 DIAGNOSIS — Z961 Presence of intraocular lens: Secondary | ICD-10-CM | POA: Diagnosis not present

## 2021-02-10 DIAGNOSIS — Z79899 Other long term (current) drug therapy: Secondary | ICD-10-CM | POA: Diagnosis not present

## 2021-02-10 DIAGNOSIS — H3021 Posterior cyclitis, right eye: Secondary | ICD-10-CM | POA: Diagnosis not present

## 2021-02-10 DIAGNOSIS — H35033 Hypertensive retinopathy, bilateral: Secondary | ICD-10-CM | POA: Diagnosis not present

## 2021-02-16 DIAGNOSIS — H3021 Posterior cyclitis, right eye: Secondary | ICD-10-CM | POA: Diagnosis not present

## 2021-02-16 DIAGNOSIS — Z79899 Other long term (current) drug therapy: Secondary | ICD-10-CM | POA: Diagnosis not present

## 2021-02-26 DIAGNOSIS — J069 Acute upper respiratory infection, unspecified: Secondary | ICD-10-CM | POA: Diagnosis not present

## 2021-03-27 DIAGNOSIS — H5213 Myopia, bilateral: Secondary | ICD-10-CM | POA: Diagnosis not present

## 2021-03-27 DIAGNOSIS — H524 Presbyopia: Secondary | ICD-10-CM | POA: Diagnosis not present
